# Patient Record
Sex: Male | Born: 1965 | Race: Black or African American | Hispanic: No | Marital: Married | Smoking: Never smoker
Health system: Southern US, Community
[De-identification: ages and names within clinical notes are randomized; demographics above are authoritative.]

## PROBLEM LIST (undated history)

## (undated) ENCOUNTER — Ambulatory Visit (HOSPITAL_COMMUNITY): Source: Home / Self Care

## (undated) DIAGNOSIS — K5792 Diverticulitis of intestine, part unspecified, without perforation or abscess without bleeding: Secondary | ICD-10-CM

## (undated) DIAGNOSIS — Z6834 Body mass index (BMI) 34.0-34.9, adult: Secondary | ICD-10-CM

---

## 2000-08-11 ENCOUNTER — Emergency Department (HOSPITAL_COMMUNITY): Admission: EM | Admit: 2000-08-11 | Discharge: 2000-08-11 | Payer: Self-pay | Admitting: Emergency Medicine

## 2000-08-11 ENCOUNTER — Encounter: Payer: Self-pay | Admitting: Emergency Medicine

## 2001-10-15 ENCOUNTER — Emergency Department (HOSPITAL_COMMUNITY): Admission: EM | Admit: 2001-10-15 | Discharge: 2001-10-15 | Payer: Self-pay | Admitting: *Deleted

## 2001-10-17 ENCOUNTER — Emergency Department (HOSPITAL_COMMUNITY): Admission: EM | Admit: 2001-10-17 | Discharge: 2001-10-17 | Payer: Self-pay | Admitting: Emergency Medicine

## 2001-11-13 ENCOUNTER — Emergency Department (HOSPITAL_COMMUNITY): Admission: EM | Admit: 2001-11-13 | Discharge: 2001-11-13 | Payer: Self-pay | Admitting: Emergency Medicine

## 2005-12-30 ENCOUNTER — Emergency Department (HOSPITAL_COMMUNITY): Admission: EM | Admit: 2005-12-30 | Discharge: 2005-12-30 | Payer: Self-pay | Admitting: Emergency Medicine

## 2009-03-29 ENCOUNTER — Emergency Department (HOSPITAL_COMMUNITY): Admission: EM | Admit: 2009-03-29 | Discharge: 2009-03-30 | Payer: Self-pay | Admitting: Emergency Medicine

## 2009-09-04 ENCOUNTER — Emergency Department (HOSPITAL_COMMUNITY): Admission: EM | Admit: 2009-09-04 | Discharge: 2009-09-04 | Payer: Self-pay | Admitting: Emergency Medicine

## 2009-09-21 ENCOUNTER — Emergency Department (HOSPITAL_COMMUNITY): Admission: EM | Admit: 2009-09-21 | Discharge: 2009-09-21 | Payer: Self-pay | Admitting: Emergency Medicine

## 2010-02-05 ENCOUNTER — Emergency Department (HOSPITAL_COMMUNITY)
Admission: EM | Admit: 2010-02-05 | Discharge: 2010-02-05 | Payer: Self-pay | Source: Home / Self Care | Admitting: Emergency Medicine

## 2010-02-05 LAB — URINALYSIS, ROUTINE W REFLEX MICROSCOPIC
Bilirubin Urine: NEGATIVE
Hgb urine dipstick: NEGATIVE
Protein, ur: NEGATIVE mg/dL
Urobilinogen, UA: 1 mg/dL (ref 0.0–1.0)

## 2010-02-05 LAB — DIFFERENTIAL
Basophils Absolute: 0 10*3/uL (ref 0.0–0.1)
Basophils Relative: 0 % (ref 0–1)
Eosinophils Absolute: 0.3 10*3/uL (ref 0.0–0.7)
Eosinophils Relative: 4 % (ref 0–5)
Lymphocytes Relative: 14 % (ref 12–46)
Lymphs Abs: 1.2 10*3/uL (ref 0.7–4.0)
Monocytes Absolute: 0.8 10*3/uL (ref 0.1–1.0)
Monocytes Relative: 10 % (ref 3–12)
Neutro Abs: 6.2 10*3/uL (ref 1.7–7.7)
Neutrophils Relative %: 73 % (ref 43–77)

## 2010-02-05 LAB — CBC
HCT: 36.8 % — ABNORMAL LOW (ref 39.0–52.0)
Hemoglobin: 12 g/dL — ABNORMAL LOW (ref 13.0–17.0)
MCH: 26.7 pg (ref 26.0–34.0)
MCHC: 32.6 g/dL (ref 30.0–36.0)
MCV: 81.8 fL (ref 78.0–100.0)
Platelets: 231 10*3/uL (ref 150–400)
RBC: 4.5 MIL/uL (ref 4.22–5.81)
RDW: 14.6 % (ref 11.5–15.5)
WBC: 8.5 10*3/uL (ref 4.0–10.5)

## 2010-02-05 LAB — BASIC METABOLIC PANEL
BUN: 12 mg/dL (ref 6–23)
CO2: 27 mEq/L (ref 19–32)
Calcium: 9 mg/dL (ref 8.4–10.5)
Chloride: 102 mEq/L (ref 96–112)
Creatinine, Ser: 1.28 mg/dL (ref 0.4–1.5)
GFR calc Af Amer: >60 mL/min (ref >60)
GFR calc non Af Amer: >60 mL/min (ref >60)
Glucose, Bld: 107 mg/dL — ABNORMAL HIGH (ref 70–99)
Potassium: 3.6 mEq/L (ref 3.5–5.1)
Sodium: 139 mEq/L (ref 135–145)

## 2010-02-05 LAB — OCCULT BLOOD, POC DEVICE: Fecal Occult Bld: NEGATIVE

## 2010-04-02 LAB — URINALYSIS, ROUTINE W REFLEX MICROSCOPIC
Bilirubin Urine: NEGATIVE
Glucose, UA: NEGATIVE mg/dL
Ketones, ur: NEGATIVE mg/dL
pH: 6 (ref 5.0–8.0)

## 2011-08-22 ENCOUNTER — Inpatient Hospital Stay (HOSPITAL_COMMUNITY)
Admission: EM | Admit: 2011-08-22 | Discharge: 2011-08-27 | DRG: 183 | Disposition: A | Discharge status: Home / Self Care | Payer: BC Managed Care – PPO | Attending: General Surgery | Admitting: General Surgery

## 2011-08-22 ENCOUNTER — Encounter (HOSPITAL_COMMUNITY): Payer: Self-pay | Admitting: Adult Health

## 2011-08-22 DIAGNOSIS — K5732 Diverticulitis of large intestine without perforation or abscess without bleeding: Principal | ICD-10-CM | POA: Diagnosis present

## 2011-08-22 DIAGNOSIS — IMO0001 Reserved for inherently not codable concepts without codable children: Secondary | ICD-10-CM

## 2011-08-22 DIAGNOSIS — K5720 Diverticulitis of large intestine with perforation and abscess without bleeding: Secondary | ICD-10-CM

## 2011-08-22 DIAGNOSIS — Z6834 Body mass index (BMI) 34.0-34.9, adult: Secondary | ICD-10-CM

## 2011-08-22 HISTORY — DX: Diverticulitis of intestine, part unspecified, without perforation or abscess without bleeding: K57.92

## 2011-08-22 HISTORY — DX: Body mass index (bmi) 34.0-34.9, adult: Z68.34

## 2011-08-22 LAB — CBC WITH DIFFERENTIAL/PLATELET
Basophils Absolute: 0 10*3/uL (ref 0.0–0.1)
Eosinophils Relative: 1 % (ref 0–5)
HCT: 40.9 % (ref 39.0–52.0)
Lymphocytes Relative: 10 % — ABNORMAL LOW (ref 12–46)
MCV: 83.6 fL (ref 78.0–100.0)
Monocytes Absolute: 1.4 10*3/uL — ABNORMAL HIGH (ref 0.1–1.0)
Monocytes Relative: 12 % (ref 3–12)
RDW: 13.9 % (ref 11.5–15.5)
WBC: 12.2 10*3/uL — ABNORMAL HIGH (ref 4.0–10.5)

## 2011-08-22 LAB — BASIC METABOLIC PANEL
BUN: 13 mg/dL (ref 6–23)
CO2: 26 mEq/L (ref 19–32)
Chloride: 97 mEq/L (ref 96–112)
Creatinine, Ser: 1.33 mg/dL (ref 0.50–1.35)

## 2011-08-22 MED ORDER — MORPHINE SULFATE 4 MG/ML IJ SOLN
4.0000 mg | Freq: Once | INTRAMUSCULAR | Status: AC
  Administered 2011-08-22: 4 mg via INTRAVENOUS
  Filled 2011-08-22: qty 1

## 2011-08-22 MED ORDER — ONDANSETRON HCL 4 MG/2ML IJ SOLN
4.0000 mg | Freq: Once | INTRAMUSCULAR | Status: AC
  Administered 2011-08-22: 4 mg via INTRAVENOUS
  Filled 2011-08-22: qty 2

## 2011-08-22 NOTE — ED Notes (Signed)
C/O lower abdominal pain that began on Monday associated with loose stools. Pt has a hx of diverticulitis and this pain feels the same. Denies nausea. Denies urinary symptoms.  Pain is described as pulling.

## 2011-08-22 NOTE — ED Provider Notes (Addendum)
History     CSN: [id number]  Arrival date & time 08/22/11  2003   First MD Initiated Contact with Patient 08/22/11 2328      Chief Complaint  Patient presents with  . Abdominal Pain    (Consider location/radiation/quality/duration/timing/severity/associated sxs/prior treatment) Patient is a 46 y.o. male presenting with abdominal pain. The history is provided by the patient.  Abdominal Pain The primary symptoms of the illness include abdominal pain. The current episode started yesterday. The onset of the illness was gradual.  The patient states that she believes she is currently not pregnant. The patient has not had a change in bowel habit.    Past Medical History  Diagnosis Date  . Diverticulitis     History reviewed. No pertinent past surgical history.  History reviewed. No pertinent family history.  History  Substance Use Topics  . Smoking status: Never Smoker   . Smokeless tobacco: Not on file  . Alcohol Use: No      Review of Systems  Gastrointestinal: Positive for abdominal pain.  All other systems reviewed and are negative.    Allergies  Review of patient's allergies indicates no known allergies.  Home Medications  No current outpatient prescriptions on file.  BP 121/81  Pulse 77  Temp 98.5 F (36.9 C)  Resp 16  SpO2 97%  Physical Exam  Constitutional: He is oriented to person, place, and time. He appears well-developed and well-nourished.  HENT:  Head: Normocephalic and atraumatic.  Eyes: Conjunctivae are normal. Pupils are equal, round, and reactive to light.  Neck: Normal range of motion. Neck supple.  Cardiovascular: Normal rate, regular rhythm, normal heart sounds and intact distal pulses.   Pulmonary/Chest: Effort normal and breath sounds normal.  Abdominal: Soft. Bowel sounds are normal.    Neurological: He is alert and oriented to person, place, and time.  Skin: Skin is warm and dry.  Psychiatric: He has a normal mood and affect.  His behavior is normal. Judgment and thought content normal.    ED Course  Procedures (including critical care time)  Labs Reviewed  BASIC METABOLIC PANEL - Abnormal; Notable for the following:    Glucose, Bld 123 (*)     GFR calc non Af Amer 64 (*)     GFR calc Af Amer 74 (*)     All other components within normal limits  CBC WITH DIFFERENTIAL - Abnormal; Notable for the following:    WBC 12.2 (*)     Neutrophils Relative 78 (*)     Neutro Abs 9.5 (*)     Lymphocytes Relative 10 (*)     Monocytes Absolute 1.4 (*)     All other components within normal limits   No results found.   No diagnosis found.    MDM  Pt with hx of diverticulitis,   Mild leukocytosis,  Mild tenderness.  Will ct, analgesia,  reassess  + contained perforation.  Discussed wih surgery,  Will admit      Rosanne Ashing, MD 08/22/11 1478  Sherman Donaldson Lytle Michaels, MD 08/23/11 2956

## 2011-08-23 ENCOUNTER — Encounter (HOSPITAL_COMMUNITY): Payer: Self-pay | Admitting: Radiology

## 2011-08-23 ENCOUNTER — Emergency Department (HOSPITAL_COMMUNITY): Payer: BC Managed Care – PPO

## 2011-08-23 DIAGNOSIS — K5732 Diverticulitis of large intestine without perforation or abscess without bleeding: Secondary | ICD-10-CM

## 2011-08-23 LAB — CBC
HCT: 40 % (ref 39.0–52.0)
Hemoglobin: 13.4 g/dL (ref 13.0–17.0)
MCH: 28 pg (ref 26.0–34.0)
MCHC: 33.5 g/dL (ref 30.0–36.0)
MCV: 83.5 fL (ref 78.0–100.0)
RBC: 4.79 MIL/uL (ref 4.22–5.81)

## 2011-08-23 LAB — COMPREHENSIVE METABOLIC PANEL
ALT: 34 U/L (ref 0–53)
AST: 22 U/L (ref 0–37)
Albumin: 3.1 g/dL — ABNORMAL LOW (ref 3.5–5.2)
CO2: 27 mEq/L (ref 19–32)
Calcium: 9 mg/dL (ref 8.4–10.5)
Sodium: 137 mEq/L (ref 135–145)
Total Protein: 7.1 g/dL (ref 6.0–8.3)

## 2011-08-23 MED ORDER — CIPROFLOXACIN IN D5W 400 MG/200ML IV SOLN
400.0000 mg | Freq: Two times a day (BID) | INTRAVENOUS | Status: DC
  Administered 2011-08-23 – 2011-08-26 (×7): 400 mg via INTRAVENOUS
  Filled 2011-08-23 (×9): qty 200

## 2011-08-23 MED ORDER — METRONIDAZOLE IN NACL 5-0.79 MG/ML-% IV SOLN
500.0000 mg | Freq: Once | INTRAVENOUS | Status: AC
  Administered 2011-08-23: 500 mg via INTRAVENOUS
  Filled 2011-08-23: qty 100

## 2011-08-23 MED ORDER — ONDANSETRON HCL 4 MG/2ML IJ SOLN: 4.0000 mg | Freq: Four times a day (QID) | INTRAMUSCULAR | Status: DC | PRN

## 2011-08-23 MED ORDER — LEVOFLOXACIN IN D5W 750 MG/150ML IV SOLN
750.0000 mg | Freq: Once | INTRAVENOUS | Status: DC
  Administered 2011-08-23: 750 mg via INTRAVENOUS
  Filled 2011-08-23: qty 150

## 2011-08-23 MED ORDER — HYDROMORPHONE HCL PF 1 MG/ML IJ SOLN
1.0000 mg | INTRAMUSCULAR | Status: DC | PRN
  Administered 2011-08-23 – 2011-08-24 (×5): 1 mg via INTRAVENOUS
  Filled 2011-08-23 (×5): qty 1

## 2011-08-23 MED ORDER — PANTOPRAZOLE SODIUM 40 MG IV SOLR
40.0000 mg | Freq: Every day | INTRAVENOUS | Status: DC
  Administered 2011-08-23 – 2011-08-26 (×4): 40 mg via INTRAVENOUS
  Filled 2011-08-23 (×6): qty 40

## 2011-08-23 MED ORDER — ENOXAPARIN SODIUM 40 MG/0.4ML ~~LOC~~ SOLN
40.0000 mg | SUBCUTANEOUS | Status: DC
  Administered 2011-08-23 – 2011-08-26 (×4): 40 mg via SUBCUTANEOUS
  Filled 2011-08-23 (×5): qty 0.4

## 2011-08-23 MED ORDER — IOHEXOL 300 MG/ML  SOLN
80.0000 mL | Freq: Once | INTRAMUSCULAR | Status: AC | PRN
  Administered 2011-08-23: 80 mL via INTRAVENOUS

## 2011-08-23 MED ORDER — BIOTENE DRY MOUTH MT LIQD
15.0000 mL | Freq: Two times a day (BID) | OROMUCOSAL | Status: DC
  Administered 2011-08-23 – 2011-08-25 (×4): 15 mL via OROMUCOSAL

## 2011-08-23 MED ORDER — ACETAMINOPHEN 325 MG PO TABS
650.0000 mg | ORAL_TABLET | Freq: Four times a day (QID) | ORAL | Status: DC | PRN
  Administered 2011-08-23 (×2): 650 mg via ORAL
  Filled 2011-08-23 (×2): qty 2

## 2011-08-23 MED ORDER — KCL IN DEXTROSE-NACL 20-5-0.9 MEQ/L-%-% IV SOLN
INTRAVENOUS | Status: DC
  Administered 2011-08-23 – 2011-08-26 (×7): via INTRAVENOUS
  Filled 2011-08-23 (×10): qty 1000

## 2011-08-23 MED ORDER — METRONIDAZOLE IN NACL 5-0.79 MG/ML-% IV SOLN
500.0000 mg | Freq: Three times a day (TID) | INTRAVENOUS | Status: DC
  Administered 2011-08-23 – 2011-08-26 (×9): 500 mg via INTRAVENOUS
  Filled 2011-08-23 (×11): qty 100

## 2011-08-23 MED ORDER — ACETAMINOPHEN 650 MG RE SUPP: 650.0000 mg | Freq: Four times a day (QID) | RECTAL | Status: DC | PRN

## 2011-08-23 NOTE — H&P (Signed)
Lance Fisher is an 46 y.o. male.   Chief Complaint: Abdominal pain HPI: Asked to see patient at the request of Dr Harvest Dark due to LLQ abdominal pain.  Started 4 days ago. Gradually worsening.   BM today.  No diarrhea.  Pain 5- 6/10.  Sharp.  Chills noted.  Made worse with movement. Ct SHOWS ACUTE DIVERTICULITIS.  Question microperforation.  Past Medical History  Diagnosis Date  . Diverticulitis     History reviewed. No pertinent past surgical history.  History reviewed. No pertinent family history. Social History:  reports that he has never smoked. He does not have any smokeless tobacco history on file. He reports that he does not drink alcohol or use illicit drugs.  Allergies: No Known Allergies   (Not in a hospital admission)  Results for orders placed during the hospital encounter of 08/22/11 (from the past 48 hour(s))  BASIC METABOLIC PANEL     Status: Abnormal   Collection Time   08/22/11  8:14 PM      Component Value Range Comment   Sodium 135  135 - 145 mEq/L    Potassium 3.5  3.5 - 5.1 mEq/L    Chloride 97  96 - 112 mEq/L    CO2 26  19 - 32 mEq/L    Glucose, Bld 123 (*) 70 - 99 mg/dL    BUN 13  6 - 23 mg/dL    Creatinine, Ser 1.61  0.50 - 1.35 mg/dL    Calcium 9.4  8.4 - 09.6 mg/dL    GFR calc non Af Amer 64 (*) >90 mL/min    GFR calc Af Amer 74 (*) >90 mL/min   CBC WITH DIFFERENTIAL     Status: Abnormal   Collection Time   08/22/11  8:14 PM      Component Value Range Comment   WBC 12.2 (*) 4.0 - 10.5 K/uL    RBC 4.89  4.22 - 5.81 MIL/uL    Hemoglobin 14.0  13.0 - 17.0 g/dL    HCT 04.5  40.9 - 81.1 %    MCV 83.6  78.0 - 100.0 fL    MCH 28.6  26.0 - 34.0 pg    MCHC 34.2  30.0 - 36.0 g/dL    RDW 91.4  78.2 - 95.6 %    Platelets 205  150 - 400 K/uL    Neutrophils Relative 78 (*) 43 - 77 %    Neutro Abs 9.5 (*) 1.7 - 7.7 K/uL    Lymphocytes Relative 10 (*) 12 - 46 %    Lymphs Abs 1.2  0.7 - 4.0 K/uL    Monocytes Relative 12  3 - 12 %    Monocytes Absolute 1.4 (*)  0.1 - 1.0 K/uL    Eosinophils Relative 1  0 - 5 %    Eosinophils Absolute 0.1  0.0 - 0.7 K/uL    Basophils Relative 0  0 - 1 %    Basophils Absolute 0.0  0.0 - 0.1 K/uL    Ct Abdomen Pelvis W Contrast  08/23/2011  *RADIOLOGY REPORT*  Clinical Data: Pain.  Nausea.  History of diverticulitis.  CT ABDOMEN AND PELVIS WITH CONTRAST  Technique:  Multidetector CT imaging of the abdomen and pelvis was performed following the standard protocol during bolus administration of intravenous contrast.  Contrast: 80mL OMNIPAQUE IOHEXOL 300 MG/ML  SOLN  Comparison: 02/05/2010.  Findings: Lung Bases: Dependent atelectasis.  Liver:  Normal.  Spleen:  Normal.  Gallbladder:  Normal.  Common bile  duct:  Normal.  Pancreas:  Normal.  Adrenal glands:  Normal.  Kidneys:  Normal renal enhancement.  Normal delayed excretion of contrast.  The ureters appear normal.  Stomach:  Normal.  Small bowel:  No obstruction or inflammatory changes.  Colon:   Normal appendix.  Inflamed colon is present in the proximal to mid sigmoid without perforation.  There is marked pericolonic soft tissue stranding and extensive diverticulosis. There appears to be thinning of the inferior wall of the colon along the sigmoid (coronal image number 37 series 401) suggesting contained perforation.  No abscess.  No free air.  Pelvic Genitourinary:  Urinary bladder appears normal.  Question varicoceles.  No inguinal adenopathy.  Bones:  Normal.  Vasculature: Grossly normal.  IMPRESSION: Acute sigmoid diverticulitis.  Attenuation of the undersurface of the sigmoid colon suggests developing contained perforation.  No abscess.  Small amount of free fluid nearby.  Extensive colonic diverticulosis.  Original Report Authenticated By: Andreas Newport, M.D.    Review of Systems  Constitutional: Positive for fever and chills.  HENT: Negative.   Eyes: Negative.   Respiratory: Negative.   Cardiovascular: Negative.   Gastrointestinal: Positive for abdominal pain.  Negative for nausea, vomiting, diarrhea and constipation.  Genitourinary: Negative.   Musculoskeletal: Negative.   Skin: Negative.   Endo/Heme/Allergies: Negative.   Psychiatric/Behavioral: Negative.     Blood pressure 97/55, pulse 83, temperature 98.5 F (36.9 C), resp. rate 16, SpO2 100.00%. Physical Exam  Constitutional: He is oriented to person, place, and time. He appears well-developed and well-nourished.  HENT:  Head: Normocephalic and atraumatic.  Eyes: EOM are normal. Pupils are equal, round, and reactive to light.  Neck: Normal range of motion. Neck supple.  Cardiovascular: Normal rate and regular rhythm.   Respiratory: Effort normal and breath sounds normal.  GI: He exhibits distension. There is tenderness.    Musculoskeletal: Normal range of motion.  Neurological: He is alert and oriented to person, place, and time.  Skin: Skin is warm and dry.  Psychiatric: He has a normal mood and affect. His behavior is normal. Thought content normal.     Assessment/Plan Acute diverticulitis with possible contained perforation.  Second attack in 19 months. Admit for IV ABX and bowel rest.  Laurna Shetley A. 08/23/2011, 2:45 AM

## 2011-08-23 NOTE — ED Notes (Signed)
Patient transported to CT 

## 2011-08-23 NOTE — Progress Notes (Signed)
Subjective: Feels better than last night.  Still tender below umbilicus, more on right side than left.  Objective: Vital signs in last 24 hours: Temp:  [98.5 F (36.9 C)] 98.5 F (36.9 C) (08/16 0501) Pulse Rate:  [77-83] 77  (08/16 0501) Resp:  [16] 16  (08/16 0231) BP: (97-121)/(55-81) 99/62 mmHg (08/16 0501) SpO2:  [97 %-100 %] 100 % (08/16 0501) Weight:  [220 lb 10.9 oz (100.1 kg)-222 lb 14.2 oz (101.1 kg)] 222 lb 14.2 oz (101.1 kg) (08/16 0501)  NPO, afebrile, BSS, BP is a low normal. AM labs stable.    Intake/Output from previous day:   Intake/Output this shift:    General appearance: alert, cooperative and no distress Resp: clear to auscultation bilaterally GI: mildly tender below umbilicus, no distension, rebound, few BS  Lab Results:   [facility] 08/23/11 0627 08/22/11 2014  WBC 11.5* 12.2*  HGB 13.4 14.0  HCT 40.0 40.9  PLT 200 205    BMET  Basename 08/23/11 0627 08/22/11 2014  NA 137 135  K 3.7 3.5  CL 101 97  CO2 27 26  GLUCOSE 125* 123*  BUN 13 13  CREATININE 1.20 1.33  CALCIUM 9.0 9.4   PT/INR No results found for this basename: LABPROT:2,INR:2 in the last 72 hours   Lab 08/23/11 0627  AST 22  ALT 34  ALKPHOS 96  BILITOT 1.4*  PROT 7.1  ALBUMIN 3.1*     Lipase  No results found for this basename: lipase     Studies/Results: Ct Abdomen Pelvis W Contrast  08/23/2011  *RADIOLOGY REPORT*  Clinical Data: Pain.  Nausea.  History of diverticulitis.  CT ABDOMEN AND PELVIS WITH CONTRAST  Technique:  Multidetector CT imaging of the abdomen and pelvis was performed following the standard protocol during bolus administration of intravenous contrast.  Contrast: 80mL OMNIPAQUE IOHEXOL 300 MG/ML  SOLN  Comparison: 02/05/2010.  Findings: Lung Bases: Dependent atelectasis.  Liver:  Normal.  Spleen:  Normal.  Gallbladder:  Normal.  Common bile duct:  Normal.  Pancreas:  Normal.  Adrenal glands:  Normal.  Kidneys:  Normal renal enhancement.  Normal  delayed excretion of contrast.  The ureters appear normal.  Stomach:  Normal.  Small bowel:  No obstruction or inflammatory changes.  Colon:   Normal appendix.  Inflamed colon is present in the proximal to mid sigmoid without perforation.  There is marked pericolonic soft tissue stranding and extensive diverticulosis. There appears to be thinning of the inferior wall of the colon along the sigmoid (coronal image number 37 series 401) suggesting contained perforation.  No abscess.  No free air.  Pelvic Genitourinary:  Urinary bladder appears normal.  Question varicoceles.  No inguinal adenopathy.  Bones:  Normal.  Vasculature: Grossly normal.  IMPRESSION: Acute sigmoid diverticulitis.  Attenuation of the undersurface of the sigmoid colon suggests developing contained perforation.  No abscess.  Small amount of free fluid nearby.  Extensive colonic diverticulosis.  Original Report Authenticated By: Andreas Newport, M.D.    Medications:    . ciprofloxacin  400 mg Intravenous Q12H  . enoxaparin (LOVENOX) injection  40 mg Subcutaneous Q24H  . metronidazole  500 mg Intravenous Once  . metronidazole  500 mg Intravenous Q8H  .  morphine injection  4 mg Intravenous Once  . ondansetron (ZOFRAN) IV  4 mg Intravenous Once  . pantoprazole (PROTONIX) IV  40 mg Intravenous QHS  . DISCONTD: levofloxacin (LEVAQUIN) IV  750 mg Intravenous Once    Assessment/Plan Acute diverticulitis with possible  contained perforation 2nd time in 19 months BMI 34   Plan:  Bowel rest, antibiotics, and IV hydration.    LOS: 1 day    Lance Fisher 08/23/2011

## 2011-08-24 NOTE — Progress Notes (Signed)
Patient ID: Lang Zingg, male   DOB: 10-20-1965, 46 y.o.   MRN: [id number]    Subjective: States that he is feeling better, no abdominal tenderness this am, + BS but no BM or flatus, no N/V.  Objective: Vital signs in last 24 hours: Temp:  [97.7 F (36.5 C)-100.9 F (38.3 C)] 98.1 F (36.7 C) (08/17 0550) Pulse Rate:  [64-85] 64  (08/17 0550) Resp:  [14-18] 14  (08/17 0550) BP: (96-131)/(54-74) 105/72 mmHg (08/17 0550) SpO2:  [96 %-99 %] 99 % (08/17 0550)  NPO, afebrile, BSS, BP is a low normal. AM labs stable. Last BM Date: 08/22/11  Intake/Output from previous day: 08/16 0701 - 08/17 0700 In: 3146.7 [I.V.:2246.7; IV Piggyback:900] Out: 750 [Urine:750] Intake/Output this shift:    General appearance: A/A/O, no distress Chest: CTA bilaterally Cardiac: RRR no M/R/G Abdomen: obese, but soft, non tender this am, +BS, no flatus or BM  VSS, afebrile, no labs today. Last Check WBC were trending down, and labs were stable.   Lab Results:   [facility] 08/23/11 0627 08/22/11 2014  WBC 11.5* 12.2*  HGB 13.4 14.0  HCT 40.0 40.9  PLT 200 205    BMET  Basename 08/23/11 0627 08/22/11 2014  NA 137 135  K 3.7 3.5  CL 101 97  CO2 27 26  GLUCOSE 125* 123*  BUN 13 13  CREATININE 1.20 1.33  CALCIUM 9.0 9.4   PT/INR No results found for this basename: LABPROT:2,INR:2 in the last 72 hours   Lab 08/23/11 0627  AST 22  ALT 34  ALKPHOS 96  BILITOT 1.4*  PROT 7.1  ALBUMIN 3.1*     Lipase  No results found for this basename: lipase     Studies/Results: Ct Abdomen Pelvis W Contrast  08/23/2011  *RADIOLOGY REPORT*  Clinical Data: Pain.  Nausea.  History of diverticulitis.  CT ABDOMEN AND PELVIS WITH CONTRAST  Technique:  Multidetector CT imaging of the abdomen and pelvis was performed following the standard protocol during bolus administration of intravenous contrast.  Contrast: 80mL OMNIPAQUE IOHEXOL 300 MG/ML  SOLN  Comparison: 02/05/2010.  Findings: Lung Bases: Dependent  atelectasis.  Liver:  Normal.  Spleen:  Normal.  Gallbladder:  Normal.  Common bile duct:  Normal.  Pancreas:  Normal.  Adrenal glands:  Normal.  Kidneys:  Normal renal enhancement.  Normal delayed excretion of contrast.  The ureters appear normal.  Stomach:  Normal.  Small bowel:  No obstruction or inflammatory changes.  Colon:   Normal appendix.  Inflamed colon is present in the proximal to mid sigmoid without perforation.  There is marked pericolonic soft tissue stranding and extensive diverticulosis. There appears to be thinning of the inferior wall of the colon along the sigmoid (coronal image number 37 series 401) suggesting contained perforation.  No abscess.  No free air.  Pelvic Genitourinary:  Urinary bladder appears normal.  Question varicoceles.  No inguinal adenopathy.  Bones:  Normal.  Vasculature: Grossly normal.  IMPRESSION: Acute sigmoid diverticulitis.  Attenuation of the undersurface of the sigmoid colon suggests developing contained perforation.  No abscess.  Small amount of free fluid nearby.  Extensive colonic diverticulosis.  Original Report Authenticated By: Andreas Newport, M.D.    Medications:    . antiseptic oral rinse  15 mL Mouth Rinse BID  . ciprofloxacin  400 mg Intravenous Q12H  . enoxaparin (LOVENOX) injection  40 mg Subcutaneous Q24H  . metronidazole  500 mg Intravenous Q8H  . pantoprazole (PROTONIX) IV  40 mg Intravenous  QHS    Assessment/Plan Acute diverticulitis with possible contained perforation 2nd time in 19 months BMI 34   Plan:   Keep NPO for now IV antibiotics IVF ambulate    LOS: 2 days    Roxi Hlavaty 08/24/2011

## 2011-08-24 NOTE — Progress Notes (Signed)
Patient ambulating, hope to start clears tomorrow Patient examined and I agree with the assessment and plan  Violeta Gelinas, MD, MPH, FACS Pager: [phone]  08/24/2011 12:07 PM

## 2011-08-25 NOTE — Progress Notes (Signed)
  Subjective: Reports almost no pain +flatus  Objective: Vital signs in last 24 hours: Temp:  [97.8 F (36.6 C)-98.7 F (37.1 C)] 97.8 F (36.6 C) (08/18 0519) Pulse Rate:  [66-78] 78  (08/18 0519) Resp:  [18] 18  (08/18 0519) BP: (100-123)/(72-85) 107/73 mmHg (08/18 0519) SpO2:  [97 %-100 %] 97 % (08/18 0519) Last BM Date: 08/22/11  Intake/Output from previous day:   Intake/Output this shift:    Comfortable Lungs clear Abdomen soft, +mild tenderness with guarding in LLQ and suprapubic  Lab Results:   [facility] 08/23/11 0627 08/22/11 2014  WBC 11.5* 12.2*  HGB 13.4 14.0  HCT 40.0 40.9  PLT 200 205   BMET  Basename 08/23/11 0627 08/22/11 2014  NA 137 135  K 3.7 3.5  CL 101 97  CO2 27 26  GLUCOSE 125* 123*  BUN 13 13  CREATININE 1.20 1.33  CALCIUM 9.0 9.4   PT/INR No results found for this basename: LABPROT:2,INR:2 in the last 72 hours ABG No results found for this basename: PHART:2,PCO2:2,PO2:2,HCO3:2 in the last 72 hours  Studies/Results: No results found.  Anti-infectives: Anti-infectives     Start     Dose/Rate Route Frequency Ordered Stop   08/23/11 0330   metroNIDAZOLE (FLAGYL) IVPB 500 mg        500 mg 100 mL/hr over 60 Minutes Intravenous Every 8 hours 08/23/11 0322     08/23/11 0330   ciprofloxacin (CIPRO) IVPB 400 mg        400 mg 200 mL/hr over 60 Minutes Intravenous Every 12 hours 08/23/11 0322     08/23/11 0230   levofloxacin (LEVAQUIN) IVPB 750 mg  Status:  Discontinued        750 mg 100 mL/hr over 90 Minutes Intravenous  Once 08/23/11 0223 08/23/11 0516   08/23/11 0230   metroNIDAZOLE (FLAGYL) IVPB 500 mg        500 mg 100 mL/hr over 60 Minutes Intravenous  Once 08/23/11 0223 08/23/11 0340          Assessment/Plan: s/p * No surgery found * Diverticulitis  Start clear liquids Continue IV antibiotics  LOS: 3 days    Stephaniemarie Stoffel A 08/25/2011

## 2011-08-26 ENCOUNTER — Encounter (HOSPITAL_COMMUNITY): Payer: Self-pay | Admitting: General Surgery

## 2011-08-26 DIAGNOSIS — IMO0001 Reserved for inherently not codable concepts without codable children: Secondary | ICD-10-CM

## 2011-08-26 DIAGNOSIS — Z6834 Body mass index (BMI) 34.0-34.9, adult: Secondary | ICD-10-CM

## 2011-08-26 HISTORY — DX: Body mass index (BMI) 34.0-34.9, adult: Z68.34

## 2011-08-26 MED ORDER — CIPROFLOXACIN HCL 500 MG PO TABS
500.0000 mg | ORAL_TABLET | Freq: Two times a day (BID) | ORAL | Status: DC
  Administered 2011-08-26 – 2011-08-27 (×2): 500 mg via ORAL
  Filled 2011-08-26 (×4): qty 1

## 2011-08-26 MED ORDER — METRONIDAZOLE 500 MG PO TABS
500.0000 mg | ORAL_TABLET | Freq: Three times a day (TID) | ORAL | Status: DC
  Administered 2011-08-26 – 2011-08-27 (×3): 500 mg via ORAL
  Filled 2011-08-26 (×6): qty 1

## 2011-08-26 NOTE — Progress Notes (Signed)
Patient ID: Lance Fisher, male   DOB: [birth year], 46 y.o.   MRN: [id number] Patient ID: Lance Fisher, male   DOB: Jan 07, 1966, 46 y.o.   MRN: [id number]    Subjective: States that he is feeling better, no abdominal tenderness this am, + BS ,BM, flatus. No N/V or bloating. Tolerated clear liquid diet.  Objective: Vital signs in last 24 hours: Temp:  [97.3 F (36.3 C)-98.5 F (36.9 C)] 97.3 F (36.3 C) (08/19 0623) Pulse Rate:  [60-79] 60  (08/19 0623) Resp:  [18] 18  (08/19 0623) BP: (120-135)/(57-76) 120/76 mmHg (08/19 0623) SpO2:  [99 %-100 %] 99 % (08/19 0623)  NPO, afebrile, BSS, BP is a low normal. AM labs stable. Last BM Date: 08/22/11  Intake/Output from previous day: 08/18 0701 - 08/19 0700 In: 2520 [P.O.:1320; I.V.:1200] Out: -  Intake/Output this shift:    General appearance: A/A/O, no distress Chest: CTA bilaterally Cardiac: RRR no M/R/G Abdomen: obese, but soft, non tender this am, +BS, BM. VSS, afebrile.   Lab Results:  No results found for this basename: WBC:2,HGB:2,HCT:2,PLT:2 in the last 72 hours  BMET No results found for this basename: NA:2,K:2,CL:2,CO2:2,GLUCOSE:2,BUN:2,CREATININE:2,CALCIUM:2 in the last 72 hours PT/INR No results found for this basename: LABPROT:2,INR:2 in the last 72 hours   Lab 08/23/11 0627  AST 22  ALT 34  ALKPHOS 96  BILITOT 1.4*  PROT 7.1  ALBUMIN 3.1*     Lipase  No results found for this basename: lipase     Studies/Results: No results found.  Medications:    . antiseptic oral rinse  15 mL Mouth Rinse BID  . ciprofloxacin  400 mg Intravenous Q12H  . enoxaparin (LOVENOX) injection  40 mg Subcutaneous Q24H  . metronidazole  500 mg Intravenous Q8H  . pantoprazole (PROTONIX) IV  40 mg Intravenous QHS    Assessment/Plan Acute diverticulitis with possible contained perforation 2nd time in 19 months BMI 34   Plan:   Advance diet to full liquid Change abx to po Ambulate ? Discharge soon.    LOS: 4 days     Lance Fisher 08/26/2011

## 2011-08-26 NOTE — Progress Notes (Signed)
UR completed 

## 2011-08-26 NOTE — Discharge Summary (Signed)
Physician Discharge Summary  Patient ID: Lance Fisher MRN: [id number] DOB/AGE: Jun 17, 1965 46 y.o.  Admit date: 08/22/2011 Discharge date: 08/27/2011  Admission Diagnoses: Acute diverticulitis with possible contained perforation  2nd time in 19 months  BMI 34   Discharge Diagnoses: Same Active Problems:  Diverticulosis, acute  BMI 34.0-34.9,adult   PROCEDURES: none  Hospital Course: Asked to see patient at the request of Dr Harvest Dark due to LLQ abdominal pain. Started 4 days ago. Gradually worsening. BM today. No diarrhea. Pain 5- 6/10. Sharp. Chills noted. Made worse with movement. Ct SHOWS ACUTE DIVERTICULITIS. Question microperforation.  He was admitted and place on bowel rest, IV hydration and IV antibiotics.  He's made good progress and has been transitioned to oral antibiotics.  He was also placed on a low residual diet.  We plan another week of oral antibiotics at home.  He is to call if he has fever or pain again.   Follow up with Dr. Janee Morn in 2 weeks, he is also to call Dr. Chestine Spore his primary care doctor for follow up. He has not had a colonoscopy at this point.  Condition on D/C:  Improving  Disposition:  Home   Medication List  As of 08/27/2011  8:30 AM   TAKE these medications         acetaminophen 325 MG tablet   Commonly known as: TYLENOL   Take 2 tablets (650 mg total) by mouth every 6 (six) hours as needed (or Temp > 100).      ciprofloxacin 500 MG tablet   Commonly known as: CIPRO   Take 1 tablet (500 mg total) by mouth 2 (two) times daily.      metroNIDAZOLE 500 MG tablet   Commonly known as: FLAGYL   Take 1 tablet (500 mg total) by mouth every 8 (eight) hours.           Follow-up Information    Follow up with [facility], MD. Schedule an appointment as soon as possible for a visit in 2 weeks.   Contact information:   [address] [place] [address] [phone]       Call Laurena Slimmer, MD. (Follow up with Dr.  Fraser Din and let him know and see when he would like to see you.)    Contact information:   [address] [place] [address] [phone]          Signed: Sherrie George 08/27/2011, 8:30 AM

## 2011-08-26 NOTE — Progress Notes (Signed)
Patient out ambulating Agree Patient examined and I agree with the assessment and plan  Violeta Gelinas, MD, MPH, FACS Pager: [phone]  08/26/2011 1:38 PM

## 2011-08-27 MED ORDER — CIPROFLOXACIN HCL 500 MG PO TABS: 500.0000 mg | ORAL_TABLET | Freq: Two times a day (BID) | ORAL | Status: AC

## 2011-08-27 MED ORDER — ACETAMINOPHEN 325 MG PO TABS: 650.0000 mg | ORAL_TABLET | Freq: Four times a day (QID) | ORAL | Status: DC | PRN

## 2011-08-27 MED ORDER — METRONIDAZOLE 500 MG PO TABS: 500.0000 mg | ORAL_TABLET | Freq: Three times a day (TID) | ORAL | Status: AC

## 2011-08-27 NOTE — Progress Notes (Signed)
  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  Objective: Vital signs in last 24 hours: Temp:  [97.8 F (36.6 C)-98.4 F (36.9 C)] 98.4 F (36.9 C) (08/20 0530) Pulse Rate:  [60-71] 61  (08/20 0530) Resp:  [18] 18  (08/20 0530) BP: (98-119)/(52-76) 98/52 mmHg (08/20 0530) SpO2:  [98 %-100 %] 98 % (08/20 0530) Last BM Date: 08/26/11  1440 ml PO recorded, low fiber diet, BP down 3AM, afebrile, no labs  Intake/Output from previous day: 08/19 0701 - 08/20 0700 In: 1440 [P.O.:1440] Out: -  Intake/Output this shift:    General appearance: alert, cooperative and no distress GI: soft, non-tender; bowel sounds normal; no masses,  no organomegaly  Lab Results:  No results found for this basename: WBC:2,HGB:2,HCT:2,PLT:2 in the last 72 hours  BMET No results found for this basename: NA:2,K:2,CL:2,CO2:2,GLUCOSE:2,BUN:2,CREATININE:2,CALCIUM:2 in the last 72 hours PT/INR No results found for this basename: LABPROT:2,INR:2 in the last 72 hours   Lab 08/23/11 0627  AST 22  ALT 34  ALKPHOS 96  BILITOT 1.4*  PROT 7.1  ALBUMIN 3.1*     Lipase  No results found for this basename: lipase     Studies/Results: No results found.  Medications:    . ciprofloxacin  500 mg Oral BID  . enoxaparin (LOVENOX) injection  40 mg Subcutaneous Q24H  . metroNIDAZOLE  500 mg Oral Q8H  . pantoprazole (PROTONIX) IV  40 mg Intravenous QHS  . DISCONTD: antiseptic oral rinse  15 mL Mouth Rinse BID  . DISCONTD: ciprofloxacin  400 mg Intravenous Q12H  . DISCONTD: metronidazole  500 mg Intravenous Q8H    Assessment/Plan Acute diverticulitis with possible contained perforation  2nd time in 19 months  BMI 34   Plan:  Home on 1 week more of antibiotics, and follow up in the office.   LOS: 5 days    Sharone Picchi 08/27/2011

## 2011-08-27 NOTE — Progress Notes (Signed)
Doing better, abdomen soft. Patient examined and I agree with the assessment and plan  Violeta Gelinas, MD, MPH, FACS Pager: [phone]  08/27/2011 10:00 AM

## 2011-08-27 NOTE — Progress Notes (Signed)
Patient discharged to home in care of spouse. Medications and instructions reviewed with patient with all questions answered. IV d/c'd with cath intact. Patient is to follow up with Dr. Janee Morn in 2 weeks and with PCP as needed.

## 2011-08-27 NOTE — Discharge Summary (Signed)
Sheralee Qazi, MD, MPH, FACS Pager: [phone]  

## 2011-09-11 ENCOUNTER — Encounter (INDEPENDENT_AMBULATORY_CARE_PROVIDER_SITE_OTHER): Payer: Self-pay | Admitting: General Surgery

## 2011-09-11 ENCOUNTER — Ambulatory Visit (INDEPENDENT_AMBULATORY_CARE_PROVIDER_SITE_OTHER): Payer: BC Managed Care – PPO | Admitting: General Surgery

## 2011-09-11 VITALS — BP 130/84 | HR 84 | Temp 98.0°F | Resp 18 | Ht 67.0 in | Wt 207.2 lb

## 2011-09-11 DIAGNOSIS — K5732 Diverticulitis of large intestine without perforation or abscess without bleeding: Secondary | ICD-10-CM | POA: Insufficient documentation

## 2011-09-11 NOTE — Progress Notes (Signed)
Subjective:     Patient ID: Lance Fisher, male   DOB: 02-Sep-1965, 46 y.o.   MRN: [id number]  HPI Patient was recently hospitalized for sigmoid diverticulitis with small abscess.He was discharged on oral antibiotics. He is completing that tomorrow. His abdominal pain has resolved. Bowel movements were initially abnormal but have returned to normal. He has adjusted his diet and is actually lost a little bit of weight.  Review of Systems     Objective:   Physical Exam  Cardiovascular: Normal rate and normal heart sounds.   Pulmonary/Chest: Effort normal and breath sounds normal.   Abdomen is soft and nontender with no mass palpable    Assessment:     Sigmoid diverticulitis with abscess improving significantly    Plan:     Complete antibiotics. Patient has had 2 episodes of diverticulitis in the past 2 years. We discussed the possibility of needing to do elective colectomy should discontinue. He is hoping to avoid surgery. I would like to see him back in 3 months. He will continue his diet adjustments. We will see how things go in the interim.

## 2011-12-04 ENCOUNTER — Encounter (INDEPENDENT_AMBULATORY_CARE_PROVIDER_SITE_OTHER): Payer: Self-pay | Admitting: General Surgery

## 2011-12-04 ENCOUNTER — Ambulatory Visit (INDEPENDENT_AMBULATORY_CARE_PROVIDER_SITE_OTHER): Payer: BC Managed Care – PPO | Admitting: General Surgery

## 2011-12-04 VITALS — BP 120/70 | HR 60 | Temp 97.3°F | Resp 18 | Ht 68.0 in | Wt 210.0 lb

## 2011-12-04 DIAGNOSIS — K5732 Diverticulitis of large intestine without perforation or abscess without bleeding: Secondary | ICD-10-CM

## 2011-12-04 NOTE — Patient Instructions (Addendum)
Call if you develop any further abdominal pain. Gradually increase fiber in your diet.

## 2011-12-04 NOTE — Progress Notes (Signed)
Subjective:     Patient ID: Lance Fisher, male   DOB: Jun 29, 1965, 46 y.o.   MRN: [id number]  HPI Patient presents for followup of sigmoid diverticulitis. He was hospitalized in August. He has been avoiding very fatty foods. His abdominal pain is completely resolved. He is having normal bowel movements. He is planning on returning to the gym to try and lose some weight. He also has some questions regarding fiber supplements and vegetable juice.  Review of Systems     Objective:   Physical Exam  Constitutional: He appears well-developed and well-nourished.  Neck: Normal range of motion.  Cardiovascular: Normal rate and normal heart sounds.   Pulmonary/Chest: Effort normal and breath sounds normal. No respiratory distress. He has no wheezes. He has no rales.  Abdominal: Soft. He exhibits no distension and no mass. There is no tenderness. There is no rebound and no guarding.       Assessment:     Sigmoid diverticulitis has completely resolved    Plan:     I discussed the pathophysiology of diverticulitis with him. I recommend gradually increasing fiber in his diet. I recommended some fiber supplements. This was his first episode so I do not believe he needs to schedule colectomy at this time. I will gladly see him if any symptoms returned and he knows to call us if this is the case.

## 2012-06-19 ENCOUNTER — Encounter (HOSPITAL_COMMUNITY): Payer: Self-pay | Admitting: *Deleted

## 2012-06-19 ENCOUNTER — Emergency Department (HOSPITAL_COMMUNITY): Payer: BC Managed Care – PPO

## 2012-06-19 ENCOUNTER — Emergency Department (HOSPITAL_COMMUNITY)
Admission: EM | Admit: 2012-06-19 | Discharge: 2012-06-19 | Disposition: A | Discharge status: Home / Self Care | Payer: BC Managed Care – PPO | Attending: Emergency Medicine | Admitting: Emergency Medicine

## 2012-06-19 DIAGNOSIS — K2970 Gastritis, unspecified, without bleeding: Secondary | ICD-10-CM | POA: Insufficient documentation

## 2012-06-19 DIAGNOSIS — Z8719 Personal history of other diseases of the digestive system: Secondary | ICD-10-CM | POA: Insufficient documentation

## 2012-06-19 DIAGNOSIS — R10813 Right lower quadrant abdominal tenderness: Secondary | ICD-10-CM | POA: Insufficient documentation

## 2012-06-19 DIAGNOSIS — Z6834 Body mass index (BMI) 34.0-34.9, adult: Secondary | ICD-10-CM | POA: Insufficient documentation

## 2012-06-19 LAB — COMPREHENSIVE METABOLIC PANEL
CO2: 29 mEq/L (ref 19–32)
Calcium: 9.4 mg/dL (ref 8.4–10.5)
Chloride: 101 mEq/L (ref 96–112)
Creatinine, Ser: 1.19 mg/dL (ref 0.50–1.35)
GFR calc non Af Amer: 73 mL/min — ABNORMAL LOW (ref >90)
Glucose, Bld: 93 mg/dL (ref 70–99)
Total Bilirubin: 0.8 mg/dL (ref 0.3–1.2)

## 2012-06-19 LAB — URINALYSIS, ROUTINE W REFLEX MICROSCOPIC
Bilirubin Urine: NEGATIVE
Glucose, UA: NEGATIVE mg/dL
Ketones, ur: NEGATIVE mg/dL
pH: 5.5 (ref 5.0–8.0)

## 2012-06-19 LAB — CBC WITH DIFFERENTIAL/PLATELET
Basophils Absolute: 0 10*3/uL (ref 0.0–0.1)
Eosinophils Relative: 3 % (ref 0–5)
HCT: 41.5 % (ref 39.0–52.0)
Lymphocytes Relative: 13 % (ref 12–46)
MCV: 83.5 fL (ref 78.0–100.0)
Monocytes Absolute: 0.7 10*3/uL (ref 0.1–1.0)
RDW: 15.2 % (ref 11.5–15.5)
WBC: 7.4 10*3/uL (ref 4.0–10.5)

## 2012-06-19 MED ORDER — ONDANSETRON HCL 4 MG/2ML IJ SOLN
4.0000 mg | Freq: Once | INTRAMUSCULAR | Status: AC
  Administered 2012-06-19: 4 mg via INTRAVENOUS
  Filled 2012-06-19: qty 2

## 2012-06-19 MED ORDER — HYDROMORPHONE HCL PF 1 MG/ML IJ SOLN
1.0000 mg | Freq: Once | INTRAMUSCULAR | Status: AC
  Administered 2012-06-19: 1 mg via INTRAVENOUS
  Filled 2012-06-19: qty 1

## 2012-06-19 MED ORDER — IOHEXOL 300 MG/ML  SOLN
25.0000 mL | Freq: Once | INTRAMUSCULAR | Status: AC | PRN
  Administered 2012-06-19: 25 mL via ORAL

## 2012-06-19 MED ORDER — SODIUM CHLORIDE 0.9 % IV BOLUS (SEPSIS)
1000.0000 mL | Freq: Once | INTRAVENOUS | Status: AC
  Administered 2012-06-19: 1000 mL via INTRAVENOUS

## 2012-06-19 MED ORDER — PANTOPRAZOLE SODIUM 40 MG PO TBEC: 40.0000 mg | DELAYED_RELEASE_TABLET | Freq: Every day | ORAL | Status: DC

## 2012-06-19 NOTE — ED Notes (Signed)
Pt reports abd pain that started this am, pt reports "feeling gassy and bloated." denies any n/v/d. No acute distress noted at triage.

## 2012-06-19 NOTE — ED Notes (Signed)
PT comfortable with d/c and f/u instructions. Prescriptions x1 

## 2012-06-19 NOTE — ED Notes (Signed)
Patient finished contrast drink.

## 2012-06-19 NOTE — ED Notes (Signed)
Ct notified pt finished contrast.  

## 2012-06-19 NOTE — ED Notes (Signed)
Pt returned from CT °

## 2012-06-19 NOTE — ED Provider Notes (Signed)
History     CSN: [id number]  Arrival date & time 06/19/12  1659   First MD Initiated Contact with Patient 06/19/12 1916      Chief Complaint  Patient presents with  . Abdominal Pain    (Consider location/radiation/quality/duration/timing/severity/associated sxs/prior treatment) Patient is a 47 y.o. male presenting with abdominal pain.  Abdominal Pain Associated symptoms include abdominal pain.   Pt with history of diverticulitis reports vague epigastric pain worsening throughout the day, not associated with nausea, vomiting or diarrhea although he has not had any appetite. He reports he tried various OTC and home remedies to help his symptoms without improvement. He reports he has had a normal BM today. No dysuria or hematuria.   Past Medical History  Diagnosis Date  . Diverticulitis   . BMI 34.0-34.9,adult 08/26/2011    History reviewed. No pertinent past surgical history.  History reviewed. No pertinent family history.  History  Substance Use Topics  . Smoking status: Never Smoker   . Smokeless tobacco: Not on file  . Alcohol Use: No      Review of Systems  Gastrointestinal: Positive for abdominal pain.   All other systems reviewed and are negative except as noted in HPI.   Allergies  Review of patient's allergies indicates no known allergies.  Home Medications  No current outpatient prescriptions on file.  BP 125/95  Pulse 57  Temp(Src) 98.4 F (36.9 C)  Resp 24  SpO2 98%  Physical Exam  Nursing note and vitals reviewed. Constitutional: He is oriented to person, place, and time. He appears well-developed and well-nourished.  HENT:  Head: Normocephalic and atraumatic.  Eyes: EOM are normal. Pupils are equal, round, and reactive to light.  Neck: Normal range of motion. Neck supple.  Cardiovascular: Normal rate, normal heart sounds and intact distal pulses.   Pulmonary/Chest: Effort normal and breath sounds normal.  Abdominal: Soft. Bowel sounds are  normal. He exhibits no distension. There is tenderness (RLQ). There is guarding (RLQ). There is no rebound.  Musculoskeletal: Normal range of motion. He exhibits no edema and no tenderness.  Neurological: He is alert and oriented to person, place, and time. He has normal strength. No cranial nerve deficit or sensory deficit.  Skin: Skin is warm and dry. No rash noted.  Psychiatric: He has a normal mood and affect.    ED Course  Procedures (including critical care time)  Labs Reviewed  COMPREHENSIVE METABOLIC PANEL - Abnormal; Notable for the following:    GFR calc non Af Amer 73 (*)    GFR calc Af Amer 84 (*)    All other components within normal limits  LIPASE, BLOOD  CBC WITH DIFFERENTIAL  URINALYSIS, ROUTINE W REFLEX MICROSCOPIC   Ct Abdomen Pelvis W Contrast  06/19/2012   *RADIOLOGY REPORT*  Clinical Data: Mid upper and right lower quadrant abdominal pain. History of diverticulitis.  CT ABDOMEN AND PELVIS WITH CONTRAST  Technique:  Multidetector CT imaging of the abdomen and pelvis was performed following the standard protocol during bolus administration of intravenous contrast.  Contrast:  100 ml Omnipaque-300  Comparison: 08/23/2011.  Findings: Normal appearing liver, spleen, pancreas, gallbladder, adrenal glands, kidneys and urinary bladder.  Mildly enlarged prostate gland with mild calcification.  No gastrointestinal abnormalities or enlarged lymph nodes.  Normal appendix.  Minimal atelectasis at both lung bases.  Minimal lumbar and lower thoracic spine degenerative changes.  IMPRESSION: No acute abnormality.  No evidence of appendicitis.   Original Report Authenticated By: Beckie Salts, M.D.  1. Gastritis       MDM  Labs ordered in triage unremarkable. Pt reports epigastric pain, but exam reveals tenderness and guarding in the RLQ. Will send for CT to rule out appendicitis or less likely diverticulitis.  10:08 PM CT as above normal. Pt sleeping comfortably. No further  pain, abdomen benign. Will d/c with PPI for possible gastritis vs PUD. PCP followup for GI referral if symptoms continue.        Trilby Way B. Bernette Mayers, MD 06/19/12 2209

## 2015-08-19 ENCOUNTER — Emergency Department (HOSPITAL_COMMUNITY): Payer: BC Managed Care – PPO

## 2015-08-19 ENCOUNTER — Emergency Department (HOSPITAL_COMMUNITY)
Admission: EM | Admit: 2015-08-19 | Discharge: 2015-08-19 | Disposition: A | Discharge status: Home / Self Care | Payer: BC Managed Care – PPO | Attending: Emergency Medicine | Admitting: Emergency Medicine

## 2015-08-19 ENCOUNTER — Ambulatory Visit (HOSPITAL_COMMUNITY)
Admission: EM | Admit: 2015-08-19 | Discharge: 2015-08-19 | Disposition: A | Discharge status: Nursing Home (Includes ICF) | Payer: BC Managed Care – PPO

## 2015-08-19 ENCOUNTER — Encounter (HOSPITAL_COMMUNITY): Payer: Self-pay | Admitting: Emergency Medicine

## 2015-08-19 DIAGNOSIS — K5730 Diverticulosis of large intestine without perforation or abscess without bleeding: Secondary | ICD-10-CM | POA: Diagnosis not present

## 2015-08-19 DIAGNOSIS — K5732 Diverticulitis of large intestine without perforation or abscess without bleeding: Secondary | ICD-10-CM | POA: Diagnosis not present

## 2015-08-19 DIAGNOSIS — R109 Unspecified abdominal pain: Secondary | ICD-10-CM | POA: Diagnosis present

## 2015-08-19 LAB — COMPREHENSIVE METABOLIC PANEL
ALT: 27 U/L (ref 17–63)
AST: 27 U/L (ref 15–41)
Albumin: 4.1 g/dL (ref 3.5–5.0)
Alkaline Phosphatase: 45 U/L (ref 38–126)
Anion gap: 8 (ref 5–15)
BILIRUBIN TOTAL: 1.4 mg/dL — AB (ref 0.3–1.2)
BUN: 12 mg/dL (ref 6–20)
CHLORIDE: 99 mmol/L — AB (ref 101–111)
CO2: 26 mmol/L (ref 22–32)
CREATININE: 1.34 mg/dL — AB (ref 0.61–1.24)
Calcium: 9 mg/dL (ref 8.9–10.3)
Glucose, Bld: 84 mg/dL (ref 65–99)
Potassium: 3.6 mmol/L (ref 3.5–5.1)
Sodium: 133 mmol/L — ABNORMAL LOW (ref 135–145)
TOTAL PROTEIN: 7.5 g/dL (ref 6.5–8.1)

## 2015-08-19 LAB — CBC
HCT: 43.9 % (ref 39.0–52.0)
Hemoglobin: 14.3 g/dL (ref 13.0–17.0)
MCH: 27.9 pg (ref 26.0–34.0)
MCHC: 32.6 g/dL (ref 30.0–36.0)
MCV: 85.7 fL (ref 78.0–100.0)
PLATELETS: 208 10*3/uL (ref 150–400)
RBC: 5.12 MIL/uL (ref 4.22–5.81)
RDW: 14.2 % (ref 11.5–15.5)
WBC: 11.3 10*3/uL — AB (ref 4.0–10.5)

## 2015-08-19 LAB — URINALYSIS, ROUTINE W REFLEX MICROSCOPIC
Bilirubin Urine: NEGATIVE
GLUCOSE, UA: NEGATIVE mg/dL
Hgb urine dipstick: NEGATIVE
KETONES UR: NEGATIVE mg/dL
LEUKOCYTES UA: NEGATIVE
NITRITE: NEGATIVE
PROTEIN: NEGATIVE mg/dL
Specific Gravity, Urine: 1.025 (ref 1.005–1.030)
pH: 5.5 (ref 5.0–8.0)

## 2015-08-19 LAB — LIPASE, BLOOD: LIPASE: 16 U/L (ref 11–51)

## 2015-08-19 MED ORDER — METRONIDAZOLE 500 MG PO TABS
500.0000 mg | ORAL_TABLET | Freq: Once | ORAL | Status: AC
  Administered 2015-08-19: 500 mg via ORAL
  Filled 2015-08-19: qty 1

## 2015-08-19 MED ORDER — ONDANSETRON HCL 4 MG PO TABS: 4.0000 mg | ORAL_TABLET | Freq: Four times a day (QID) | ORAL | 0 refills | Status: DC

## 2015-08-19 MED ORDER — SODIUM CHLORIDE 0.9 % IV BOLUS (SEPSIS)
1000.0000 mL | Freq: Once | INTRAVENOUS | Status: AC
  Administered 2015-08-19: 1000 mL via INTRAVENOUS

## 2015-08-19 MED ORDER — IOPAMIDOL (ISOVUE-300) INJECTION 61%
INTRAVENOUS | Status: AC
  Administered 2015-08-19: 100 mL
  Filled 2015-08-19: qty 100

## 2015-08-19 MED ORDER — CIPROFLOXACIN HCL 500 MG PO TABS: 500.0000 mg | ORAL_TABLET | Freq: Two times a day (BID) | ORAL | 0 refills | Status: DC

## 2015-08-19 MED ORDER — CIPROFLOXACIN HCL 500 MG PO TABS
500.0000 mg | ORAL_TABLET | Freq: Once | ORAL | Status: AC
  Administered 2015-08-19: 500 mg via ORAL
  Filled 2015-08-19: qty 1

## 2015-08-19 MED ORDER — HYDROCODONE-ACETAMINOPHEN 5-325 MG PO TABS: 1.0000 | ORAL_TABLET | ORAL | 0 refills | Status: DC | PRN

## 2015-08-19 MED ORDER — METRONIDAZOLE 500 MG PO TABS: 500.0000 mg | ORAL_TABLET | Freq: Two times a day (BID) | ORAL | 0 refills | Status: DC

## 2015-08-19 NOTE — ED Notes (Signed)
Report called to Mellody DanceKeith, ED Triage RN.

## 2015-08-19 NOTE — ED Triage Notes (Signed)
Pt. Stated, I started having stomach pain lower center.

## 2015-08-19 NOTE — ED Triage Notes (Signed)
Pt c/o intermittent abd pain onset yest associated nausea... Reports hx of diverticulitis... LBM = 8/10  A&O x4... NAD

## 2015-08-19 NOTE — ED Provider Notes (Signed)
[facility]   CSN: [id number] Arrival date & time: 08/19/15  1329  First Provider Contact:  None       History   Chief Complaint Chief Complaint  Patient presents with  . Bloated  . Abdominal Pain    HPI Lance Fisher is a 50 y.o. male.  Pt has a hx of diverticulitis and presents to the ED today with possible diverticulitis.  The pt said that [facility].  The pt said that he had some nausea last night, but none now.  He currently does not want any pain meds.      Past Medical History:  Diagnosis Date  . BMI 34.0-34.9,adult 08/26/2011  . Diverticulitis     Patient Active Problem List   Diagnosis Date Noted  . Sigmoid diverticulitis 09/11/2011  . Diverticulosis, acute 08/26/2011  . BMI 34.0-34.9,adult 08/26/2011    History reviewed. No pertinent surgical history.     Home Medications    Prior to Admission medications   Medication Sig Start Date End Date Taking? Authorizing Provider  ciprofloxacin (CIPRO) 500 MG tablet Take 1 tablet (500 mg total) by mouth every 12 (twelve) hours. 08/19/15   Jacalyn LefevreJulie Adelie Croswell, MD  HYDROcodone-acetaminophen (NORCO/VICODIN) 5-325 MG tablet Take 1 tablet by mouth every 4 (four) hours as needed. 08/19/15   Jacalyn LefevreJulie Sebastain Fishbaugh, MD  metroNIDAZOLE (FLAGYL) 500 MG tablet Take 1 tablet (500 mg total) by mouth 2 (two) times daily. 08/19/15   Jacalyn LefevreJulie Leander Tout, MD  ondansetron (ZOFRAN) 4 MG tablet Take 1 tablet (4 mg total) by mouth every 6 (six) hours. 08/19/15   Jacalyn LefevreJulie Desjuan Stearns, MD    Family History No family history on file.  Social History Social History  Substance Use Topics  . Smoking status: Never Smoker  . Smokeless tobacco: Never Used  . Alcohol use No     Allergies   Review of patient's allergies indicates no known allergies.   Review of Systems Review of Systems  Gastrointestinal: Positive for abdominal pain and nausea.  All other systems reviewed and are  negative.    Physical Exam Updated Vital Signs BP 108/72   Pulse (!) 56   Temp 98.5 F (36.9 C) (Oral)   Resp 16   Ht 5\' 8"  (1.727 m)   Wt 218 lb 9 oz (99.1 kg)   SpO2 100%   BMI 33.23 kg/m   Physical Exam  Constitutional: He is oriented to person, place, and time. He appears well-developed and well-nourished.  HENT:  Head: Normocephalic and atraumatic.  Right Ear: External ear normal.  Left Ear: External ear normal.  Nose: Nose normal.  Mouth/Throat: Oropharynx is clear and moist.  Eyes: Conjunctivae and EOM are normal. Pupils are equal, round, and reactive to light.  Neck: Normal range of motion. Neck supple.  Cardiovascular: Normal rate, regular rhythm, normal heart sounds and intact distal pulses.   Pulmonary/Chest: Effort normal and breath sounds normal.  Abdominal: Soft. Normal appearance, normal aorta and bowel sounds are normal. There is tenderness in the suprapubic area and left lower quadrant.  Musculoskeletal: Normal range of motion.  Neurological: He is alert and oriented to person, place, and time.  Skin: Skin is warm and dry.  Psychiatric: He has a normal mood and affect. His behavior is normal. Judgment and thought content normal.  Nursing note and vitals reviewed.    ED Treatments / Results  Labs (all labs ordered are listed, but only abnormal results are displayed) Labs Reviewed  COMPREHENSIVE METABOLIC PANEL - Abnormal; Notable for the following:       Result Value   Sodium 133 (*)    Chloride 99 (*)    Creatinine, Ser 1.34 (*)    Total Bilirubin 1.4 (*)    All other components within normal limits  CBC - Abnormal; Notable for the following:    WBC 11.3 (*)    All other components within normal limits  LIPASE, BLOOD  URINALYSIS, ROUTINE W REFLEX MICROSCOPIC (NOT AT [facility])    EKG  EKG Interpretation None       Radiology Ct Abdomen Pelvis W Contrast  Result Date: 08/19/2015 CLINICAL DATA:  Lower abdominal pain since yesterday, LEFT  lower quadrant pain, question diverticulitis EXAM: CT ABDOMEN AND PELVIS WITH CONTRAST TECHNIQUE: Multidetector CT imaging of the abdomen and pelvis was performed using the standard protocol following bolus administration of intravenous contrast. Sagittal and coronal MPR images reconstructed from axial data set. CONTRAST:  ISOVUE-300 IOPAMIDOL (ISOVUE-300) INJECTION 61% IV. Oral contrast was not administered. COMPARISON:  06/19/2012 FINDINGS: Lower chest:  Lung bases clear Hepatobiliary: Gallbladder and liver normal appearance Pancreas: Normal appearance Spleen: Normal appearance, small splenules noted Adrenals/Urinary Tract: Adrenal glands normal appearance. Kidneys, ureters, bladder, and prostate gland normal appearance Stomach/Bowel: Normal appendix. Diverticulosis of descending and sigmoid colon with segmental wall thickening in the sigmoid colon associated with pericolic infiltrative changes compatible with acute diverticulitis. Stranding of mesocolon without discrete abscess collection. No definite extraluminal gas or free intraperitoneal air. Stomach and remaining bowel loops normal appearance. Vascular/Lymphatic: Aorta normal caliber.  No adenopathy. Reproductive: N/A Other: No free air or free fluid. Musculoskeletal: Unremarkable IMPRESSION: Sigmoid diverticulitis without evidence of perforation or abscess. Electronically Signed   By: Ulyses Southward M.D.   On: 08/19/2015 16:06    Procedures Procedures (including critical care time)  Medications Ordered in ED Medications  sodium chloride 0.9 % bolus 1,000 mL (0 mLs Intravenous Stopped 08/19/15 1644)  iopamidol (ISOVUE-300) 61 % injection (100 mLs  Contrast Given 08/19/15 1537)  ciprofloxacin (CIPRO) tablet 500 mg (500 mg Oral Given 08/19/15 1634)  metroNIDAZOLE (FLAGYL) tablet 500 mg (500 mg Oral Given 08/19/15 1634)     Initial Impression / Assessment and Plan / ED Course  I have reviewed the triage vital signs and the nursing  notes.  Pertinent labs & imaging results that were available during my care of the patient were reviewed by me and considered in my medical decision making (see chart for details).  Clinical Course   Pain still tolerable in the ED.  Pt does not want anything for pain in the ED.  Final Clinical Impressions(s) / ED Diagnoses   Final diagnoses:  Diverticulitis of large intestine without perforation or abscess without bleeding    New Prescriptions Discharge Medication List as of 08/19/2015  3:58 PM    START taking these medications   Details  ciprofloxacin (CIPRO) 500 MG tablet Take 1 tablet (500 mg total) by mouth every 12 (twelve) hours., Starting Sat 08/19/2015, Print    HYDROcodone-acetaminophen (NORCO/VICODIN) 5-325 MG tablet Take 1 tablet by mouth every 4 (four) hours as needed., Starting Sat 08/19/2015, Print    metroNIDAZOLE (FLAGYL) 500 MG tablet Take 1 tablet (500 mg total) by mouth 2 (two) times daily., Starting Sat 08/19/2015, Print    ondansetron (ZOFRAN) 4 MG tablet Take 1 tablet (4 mg total) by mouth every 6 (six) hours., Starting Sat 08/19/2015, Print         Jacalyn Lefevre, MD 08/20/15  0700  

## 2015-08-19 NOTE — ED Provider Notes (Signed)
Signed out by Dr Particia NearingHaviland at 1600 that suspected diverticulitis, d/c instructions are done, to d/c to home when ct confirms diverticulitis.   Ct c/w diverticulitis. Pt comfortable. No distress.  Vitals:   08/19/15 1354 08/19/15 1449  BP: 124/81 130/86  Pulse: 65 67  Resp: 17 16  Temp: 98.5 F (36.9 C)    Patient currently appears stable for d/c.      Cathren LaineKevin Toula Miyasaki, MD 08/19/15 1639

## 2015-08-19 NOTE — ED Provider Notes (Signed)
[facility]    CSN: [id number] Arrival date & time: 08/19/15  1232  First Provider Contact:  First MD Initiated Contact with Patient 08/19/15 1248        History   Chief Complaint Chief Complaint  Patient presents with  . Abdominal Pain    HPI Lance Fisher is a 50 y.o. male.   The history is provided by the patient.  Abdominal Pain  Pain location:  Suprapubic and LLQ Pain quality: cramping   Pain radiates to:  Does not radiate Pain severity:  Mild Onset quality:  Gradual Duration:  1 day Progression:  Unchanged Chronicity:  Recurrent Context: recent travel   Context comment:  H/o divert in 2014, concern for similar sx. Relieved by:  Nothing Worsened by:  Nothing Ineffective treatments:  None tried Associated symptoms: constipation and nausea   Associated symptoms: no diarrhea, no dysuria, no fever, no melena and no vomiting   Associated symptoms comment:  No bm since thurs, 1 episode of nausea.   Past Medical History:  Diagnosis Date  . BMI 34.0-34.9,adult 08/26/2011  . Diverticulitis     Patient Active Problem List   Diagnosis Date Noted  . Sigmoid diverticulitis 09/11/2011  . Diverticulosis, acute 08/26/2011  . BMI 34.0-34.9,adult 08/26/2011    History reviewed. No pertinent surgical history.     Home Medications    Prior to Admission medications   Medication Sig Start Date End Date Taking? Authorizing Provider  pantoprazole (PROTONIX) 40 MG tablet Take 1 tablet (40 mg total) by mouth daily. 06/19/12   Susy Frizzle, MD    Family History History reviewed. No pertinent family history.  Social History Social History  Substance Use Topics  . Smoking status: Never Smoker  . Smokeless tobacco: Never Used  . Alcohol use No     Allergies   Review of patient's allergies indicates no known allergies.   Review of Systems Review of Systems  Constitutional: Negative.  Negative for fever.  Gastrointestinal: Positive for abdominal  pain, constipation and nausea. Negative for blood in stool, diarrhea, melena and vomiting.  Genitourinary: Negative.  Negative for dysuria.  All other systems reviewed and are negative.    Physical Exam Triage Vital Signs ED Triage Vitals  Enc Vitals Group     BP 08/19/15 1251 131/81     Pulse Rate 08/19/15 1251 69     Resp 08/19/15 1251 18     Temp 08/19/15 1251 98 F (36.7 C)     Temp Source 08/19/15 1251 Oral     SpO2 08/19/15 1251 97 %     Weight --      Height --      Head Circumference --      Peak Flow --      Pain Score 08/19/15 1252 4     Pain Loc --      Pain Edu? --      Excl. in GC? --    No data found.   Updated Vital Signs BP 131/81 (BP Location: Left Arm)   Pulse 69   Temp 98 F (36.7 C) (Oral)   Resp 18   SpO2 97%   Visual Acuity Right Eye Distance:   Left Eye Distance:   Bilateral Distance:    Right Eye Near:   Left Eye Near:    Bilateral Near:     Physical Exam  Constitutional: He is oriented to person, place, and time. He appears well-developed and well-nourished. No distress.  Abdominal: Soft. He  exhibits no mass. Bowel sounds are decreased. There is tenderness in the suprapubic area. There is no rebound and no guarding. No hernia.    Neurological: He is alert and oriented to person, place, and time.  Skin: Skin is warm and dry.  Nursing note and vitals reviewed.    UC Treatments / Results  Labs (all labs ordered are listed, but only abnormal results are displayed) Labs Reviewed - No data to display  EKG  EKG Interpretation None       Radiology No results found.  Procedures Procedures (including critical care time)  Medications Ordered in UC Medications - No data to display   Initial Impression / Assessment and Plan / UC Course  I have reviewed the triage vital signs and the nursing notes.  Pertinent labs & imaging results that were available during my care of the patient were reviewed by me and considered in my  medical decision making (see chart for details).  Clinical Course   Sent for eval of lower abd pain, likely diverticulitis  Final Clinical Impressions(s) / UC Diagnoses   Final diagnoses:  None    New Prescriptions New Prescriptions   No medications on file     Linna HoffJames D Lynze Reddy, MD 08/19/15 1303

## 2015-10-01 ENCOUNTER — Emergency Department (HOSPITAL_COMMUNITY): Payer: BC Managed Care – PPO

## 2015-10-01 ENCOUNTER — Emergency Department (HOSPITAL_COMMUNITY)
Admission: EM | Admit: 2015-10-01 | Discharge: 2015-10-01 | Disposition: A | Discharge status: Home / Self Care | Payer: BC Managed Care – PPO | Attending: Emergency Medicine | Admitting: Emergency Medicine

## 2015-10-01 ENCOUNTER — Encounter (HOSPITAL_COMMUNITY): Payer: Self-pay | Admitting: *Deleted

## 2015-10-01 DIAGNOSIS — R451 Restlessness and agitation: Secondary | ICD-10-CM | POA: Diagnosis present

## 2015-10-01 DIAGNOSIS — Z5181 Encounter for therapeutic drug level monitoring: Secondary | ICD-10-CM | POA: Diagnosis not present

## 2015-10-01 DIAGNOSIS — J069 Acute upper respiratory infection, unspecified: Secondary | ICD-10-CM

## 2015-10-01 DIAGNOSIS — F419 Anxiety disorder, unspecified: Secondary | ICD-10-CM | POA: Insufficient documentation

## 2015-10-01 DIAGNOSIS — R443 Hallucinations, unspecified: Secondary | ICD-10-CM | POA: Diagnosis not present

## 2015-10-01 LAB — CBC WITH DIFFERENTIAL/PLATELET
Basophils Absolute: 0 10*3/uL (ref 0.0–0.1)
Basophils Relative: 0 %
EOS ABS: 0.1 10*3/uL (ref 0.0–0.7)
Eosinophils Relative: 1 %
HCT: 44.1 % (ref 39.0–52.0)
HEMOGLOBIN: 14.4 g/dL (ref 13.0–17.0)
LYMPHS ABS: 1.2 10*3/uL (ref 0.7–4.0)
Lymphocytes Relative: 13 %
MCH: 28.2 pg (ref 26.0–34.0)
MCHC: 32.7 g/dL (ref 30.0–36.0)
MCV: 86.5 fL (ref 78.0–100.0)
MONO ABS: 0.7 10*3/uL (ref 0.1–1.0)
MONOS PCT: 7 %
NEUTROS PCT: 79 %
Neutro Abs: 7.5 10*3/uL (ref 1.7–7.7)
Platelets: 217 10*3/uL (ref 150–400)
RBC: 5.1 MIL/uL (ref 4.22–5.81)
RDW: 14.6 % (ref 11.5–15.5)
WBC: 9.5 10*3/uL (ref 4.0–10.5)

## 2015-10-01 LAB — COMPREHENSIVE METABOLIC PANEL
ALBUMIN: 4.7 g/dL (ref 3.5–5.0)
ALK PHOS: 49 U/L (ref 38–126)
ALT: 74 U/L — AB (ref 17–63)
ANION GAP: 9 (ref 5–15)
AST: 68 U/L — AB (ref 15–41)
BUN: 12 mg/dL (ref 6–20)
CALCIUM: 9.4 mg/dL (ref 8.9–10.3)
CO2: 26 mmol/L (ref 22–32)
CREATININE: 1.33 mg/dL — AB (ref 0.61–1.24)
Chloride: 104 mmol/L (ref 101–111)
GFR calc Af Amer: >60 mL/min (ref >60)
GFR calc non Af Amer: >60 mL/min (ref >60)
GLUCOSE: 96 mg/dL (ref 65–99)
Potassium: 3.3 mmol/L — ABNORMAL LOW (ref 3.5–5.1)
SODIUM: 139 mmol/L (ref 135–145)
Total Bilirubin: 0.8 mg/dL (ref 0.3–1.2)
Total Protein: 8.3 g/dL — ABNORMAL HIGH (ref 6.5–8.1)

## 2015-10-01 LAB — RAPID URINE DRUG SCREEN, HOSP PERFORMED
Amphetamines: NOT DETECTED
BARBITURATES: NOT DETECTED
Benzodiazepines: NOT DETECTED
Cocaine: NOT DETECTED
OPIATES: NOT DETECTED
TETRAHYDROCANNABINOL: NOT DETECTED

## 2015-10-01 LAB — ETHANOL: Alcohol, Ethyl (B): <5 mg/dL (ref <5)

## 2015-10-01 NOTE — BH Assessment (Signed)
Assessment completed. Consulted Nira ConnJason Berry, NP who agrees that pt does not meet inpatient criteria and should be referred to outpatient. Informed Elson AreasLeslie K Sofia, PA-C and pt of the recommendation. Pt is agreeable to contacting his EAP to set up counseling services. Pt was also provided with a list of outpatient resources.

## 2015-10-01 NOTE — ED Triage Notes (Signed)
Pt presents with sinus problem and trouble sleeping lately.  Per friend report: pt had an episode at church in which pt was observed speaking rapidly and talking about things that did not make sense.  Pt reports an episode like his occurred in 1998 in which pt was hospitalized for.  Pt denies SI/HI/hallucinations.  Pt currently calm, cooperative with no abnormal speech or behavior noted.

## 2015-10-01 NOTE — ED Notes (Signed)
Pt cannot use restroom at this time, aware urine specimen is needed.  

## 2015-10-01 NOTE — ED Provider Notes (Signed)
[facility]-EMERGENCY DEPT Provider Note   CSN: [id number] Arrival date & time: 10/01/15  1350     History   Chief Complaint No chief complaint on file.   HPI Lance Fisher is a 50 y.o. male.  The history is provided by the patient. No language interpreter was used.  Mental Health Problem  Presenting symptoms: agitation, bizarre behavior, delusional, disorganized speech, disorganized thought process and hallucinations   Patient accompanied by:  Family member Onset quality:  Sudden Timing:  Constant Progression:  Waxing and waning Context: not alcohol use and not drug abuse   Relieved by:  Nothing Worsened by:  Nothing Ineffective treatments:  None tried Risk factors: no family hx of mental illness   Pt was sent in by NP at his church.  Pt began yelling during service.  The congregation initially thought he was excited by service. She reports pt proceeded to have a full psychotic episode, yelling, crying, talking irrationally.  She reports pt has a history of similar episode 10 years ago.  She advised family to bring pt here.  Pt reports he has been on a cleansing diet and juicing and thinks that caused his episode.  Pt calm and able to give history.  He is unaware of event.   Past Medical History:  Diagnosis Date  . BMI 34.0-34.9,adult 08/26/2011  . Diverticulitis     Patient Active Problem List   Diagnosis Date Noted  . Sigmoid diverticulitis 09/11/2011  . Diverticulosis, acute 08/26/2011  . BMI 34.0-34.9,adult 08/26/2011    History reviewed. No pertinent surgical history.     Home Medications    Prior to Admission medications   Medication Sig Start Date End Date Taking? Authorizing Provider  ciprofloxacin (CIPRO) 500 MG tablet Take 1 tablet (500 mg total) by mouth every 12 (twelve) hours. Patient not taking: Reported on 10/01/2015 08/19/15   Jacalyn LefevreJulie Haviland, MD  HYDROcodone-acetaminophen (NORCO/VICODIN) 5-325 MG tablet Take 1 tablet by mouth every 4 (four) hours as  needed. Patient not taking: Reported on 10/01/2015 08/19/15   Jacalyn LefevreJulie Haviland, MD  metroNIDAZOLE (FLAGYL) 500 MG tablet Take 1 tablet (500 mg total) by mouth 2 (two) times daily. Patient not taking: Reported on 10/01/2015 08/19/15   Jacalyn LefevreJulie Haviland, MD  ondansetron (ZOFRAN) 4 MG tablet Take 1 tablet (4 mg total) by mouth every 6 (six) hours. Patient not taking: Reported on 10/01/2015 08/19/15   Jacalyn LefevreJulie Haviland, MD    Family History No family history on file.  Social History Social History  Substance Use Topics  . Smoking status: Never Smoker  . Smokeless tobacco: Never Used  . Alcohol use No     Allergies   Review of patient's allergies indicates no known allergies.   Review of Systems Review of Systems  HENT: Positive for sinus pressure.   Respiratory: Positive for cough. Negative for shortness of breath.   Psychiatric/Behavioral: Positive for agitation and hallucinations.  All other systems reviewed and are negative.    Physical Exam Updated Vital Signs BP (!) 151/103   Pulse 70   Temp 97.8 F (36.6 C) (Oral)   Resp 18   SpO2 97%   Physical Exam  Constitutional: He appears well-developed and well-nourished.  HENT:  Head: Normocephalic and atraumatic.  Nasal congestion  Eyes: Conjunctivae are normal.  Neck: Neck supple.  Cardiovascular: Normal rate and regular rhythm.   No murmur heard. Pulmonary/Chest: Effort normal and breath sounds normal. No respiratory distress.  Abdominal: Soft. There is no tenderness.  Musculoskeletal: He exhibits no edema.  Neurological: He is alert.  Skin: Skin is warm and dry.  Psychiatric: He has a normal mood and affect.  Pt is alert, answers questions appropriately, pt does not seem manic, he is not psychotic.    Nursing note and vitals reviewed.    ED Treatments / Results  Labs (all labs ordered are listed, but only abnormal results are displayed) Labs Reviewed  COMPREHENSIVE METABOLIC PANEL - Abnormal; Notable for the  following:       Result Value   Potassium 3.3 (*)    Creatinine, Ser 1.33 (*)    Total Protein 8.3 (*)    AST 68 (*)    ALT 74 (*)    All other components within normal limits  ETHANOL  CBC WITH DIFFERENTIAL/PLATELET  URINE RAPID DRUG SCREEN, HOSP PERFORMED    EKG  EKG Interpretation None       Radiology Dg Chest 2 View  Result Date: 10/01/2015 CLINICAL DATA:  Sudden onset cough, sneezing, tremors, shortness of breath and chest pressure for 1 day since breathing in a seasoning packet for meat loaf. EXAM: CHEST  2 VIEW COMPARISON:  None. FINDINGS: The lungs are clear. Heart size is normal. No pneumothorax or pleural effusion. No bony abnormality. IMPRESSION: Negative chest. Electronically Signed   By: Drusilla Kanner M.D.   On: 10/01/2015 17:28    Procedures Procedures (including critical care time)  Medications Ordered in ED Medications - No data to display   Initial Impression / Assessment and Plan / ED Course  I have reviewed the triage vital signs and the nursing notes.  Pertinent labs & imaging results that were available during my care of the patient were reviewed by me and considered in my medical decision making (see chart for details).  Clinical Course    TTS assessed pt and advised pt to follow up outpatient.  Pt given resources.  He is advised to return if any problems.  Final Clinical Impressions(s) / ED Diagnoses   Final diagnoses:  URI, acute  Anxiety    New Prescriptions New Prescriptions   No medications on file  An After Visit Summary was printed and given to the patient.   Lonia Skinner [place], PA-C 10/01/15 2018    Raeford Razor, MD 10/01/15 [phone]

## 2015-10-01 NOTE — ED Triage Notes (Signed)
Pt has been calm and sleeping on and off since [facility]

## 2015-10-01 NOTE — Discharge Instructions (Signed)
Follow up for outpatient counseling and treatment.

## 2015-10-01 NOTE — BH Assessment (Signed)
Tele Assessment Note   Lance Fisher is an 50 y.o. male presenting to [facility] accompanied by his wife. 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 "It had me jittery and I was upset at church". "The nurse asked me to come to the hospital to get checked". "I just think I over did it". Pt reported that he ordered the detox from the [employer] morning show and he has been juicing. Pt also reported that he has only been eating one meal a day. Pt also reported that he was sneezing last night so he took some [place] and was unable to sleep last night. Pt denies SI, HI and AVH at this time. Pt did not report any previous suicide attempts or self-injurious behaviors.  Pt did not report  any depressive symptoms but shared that he his job transferred him to [employer] and now he has a larger class but he is unsure if it is stressful. Pt shared that he is developing a routine. No drugs or alcohol use reported.  No current mental health treatment. Pt reported that he was hospitalized in 1998 due to depression after the death of a family member. Pt is currently employed and reported that he has a good support system with includes his wife, church family and colleagues.  Collateral information was gathered from pt's wife Lance Fisher and church nurse, Vance Peper. Pt's wife reported that his sleep pattern has changed and he will go to bed late and get up early in the morning. She reported that he is sleeping 4 hours max. 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 She reported that the nurse in the church notice that he was getting hyper. She reported that last night pt was coughing and sneezing and he thought it was because of the seasoning she put in the food but she believes it was from the  seasoning. She shared that pt need something for balance.  Vance Peper (church nurse) reported that today while in service pt began crying and acting out of control (excited and jumping around). She stated "he had an episode". She reported that it is a psychological issues and he is not behaving normal, talking non-stop and talking about things not pertinent to what was going on. She reported that pt was talking about people that have passed away and worrying about dying. She reported that pt is manic and needs a mood stabilizer. Pt reported that pt is unable to recall the span of time when this was happening and she is concern. She reported that last Sunday pt had "an obvious break". She also shared that pt has not been sleeping.  Pt is not a danger to self or others. Pt does not meet inpatient criteria and it has been determined that outpatient therapy would be appropriate for managing pt's symptoms.    Diagnosis: Adjustment disorder, with anxiety   Past Medical History:  Past Medical History:  Diagnosis Date  . BMI 34.0-34.9,adult 08/26/2011  . Diverticulitis     History reviewed. No pertinent surgical history.  Family History: No family history on file.  Social History:  reports that he has never smoked. He has never used smokeless tobacco. He reports that he does not drink alcohol or use  drugs.  Additional Social History:  Alcohol / Drug Use History of alcohol / drug use?: No history of alcohol / drug abuse  CIWA: CIWA-Ar BP: (!) 158/103 Pulse Rate: 75 COWS:    PATIENT STRENGTHS: (choose at least two) Average or above average intelligence Capable of independent living Physical Health Religious Affiliation  Allergies: No Known Allergies  Home Medications:  (Not in a hospital admission)  OB/GYN Status:  No LMP for male patient.  General Assessment Data Location of Assessment: [facility] TTS Assessment: In system Is this a Tele or Face-to-Face Assessment?: Face-to-Face Is  this an Initial Assessment or a Re-assessment for this encounter?: Initial Assessment Marital status: Married Living Arrangements: Spouse/significant other, Children Can pt return to current living arrangement?: Yes Admission Status: Voluntary Is patient capable of signing voluntary admission?: Yes Referral Source: Self/Family/Friend Insurance type: BCBS     Crisis Care Plan Living Arrangements: Spouse/significant other, Children Name of Psychiatrist: No provider reported  Name of Therapist: No provider reported.   Education Status Is patient currently in school?: No Highest grade of school patient has completed: Masters   Risk to self with the past 6 months Suicidal Ideation: No Has patient been a risk to self within the past 6 months prior to admission? : No Suicidal Intent: No Has patient had any suicidal intent within the past 6 months prior to admission? : No Is patient at risk for suicide?: No Suicidal Plan?: No Has patient had any suicidal plan within the past 6 months prior to admission? : No Access to Means: No What has been your use of drugs/alcohol within the last 12 months?: No drug or alcohol use reported.  Previous Attempts/Gestures: No How many times?: 0 Other Self Harm Risks: Denies  Triggers for Past Attempts: None known (No previous attempts reported. ) Intentional Self Injurious Behavior: None Family Suicide History: No Recent stressful life event(s):  (Started a new job at [employer] ) Persecutory voices/beliefs?: No Depression: No Depression Symptoms:  (None reported. ) Substance abuse history and/or treatment for substance abuse?: No  Risk to Others within the past 6 months Homicidal Ideation: No Does patient have any lifetime risk of violence toward others beyond the six months prior to admission? : No Thoughts of Harm to Others: No Current Homicidal Intent: No Current Homicidal Plan: No Access to Homicidal Means: No Identified Victim:  N/A History of harm to others?: No Assessment of Violence: None Noted Violent Behavior Description: No violent behaviors observed. Pt is calm and cooperative at this time.  Does patient have access to weapons?: No Criminal Charges Pending?: No Does patient have a court date: No Is patient on probation?: No  Psychosis Hallucinations: None noted Delusions: None noted  Mental Status Report Appearance/Hygiene: Unremarkable Eye Contact: Good Motor Activity: Freedom of movement Speech: Logical/coherent Level of Consciousness: Quiet/awake Mood: Pleasant Affect: Appropriate to circumstance Anxiety Level: Minimal Thought Processes: Coherent, Relevant Judgement: Unimpaired Orientation: Person, Place, Time, Situation, Appropriate for developmental age Obsessive Compulsive Thoughts/Behaviors: None  Cognitive Functioning Concentration: Normal Memory: Recent Intact, Remote Intact IQ: Average Insight: Good Impulse Control: Good Appetite: Good Sleep: Decreased Total Hours of Sleep: 5 Vegetative Symptoms: None  ADLScreening [facility]) Patient's cognitive ability adequate to safely complete daily activities?: Yes Patient able to express need for assistance with ADLs?: Yes Independently performs ADLs?: Yes (appropriate for developmental age)  Prior Inpatient Therapy Prior Inpatient Therapy: Yes Prior Therapy Dates: 1998 Prior Therapy Facilty/Provider(s): [facility] Reason for Treatment: Depression   Prior Outpatient  Therapy Prior Outpatient Therapy: No Does patient have an ACCT team?: No Does patient have Intensive In-House Services?  : No Does patient have [facility] services? : No Does patient have P4CC services?: No  ADL Screening (condition at time of admission) Patient's cognitive ability adequate to safely complete daily activities?: Yes Is the patient deaf or have difficulty hearing?: No Does the patient have difficulty seeing, even when wearing glasses/contacts?:  No Does the patient have difficulty concentrating, remembering, or making decisions?: No Patient able to express need for assistance with ADLs?: Yes Does the patient have difficulty dressing or bathing?: No Independently performs ADLs?: Yes (appropriate for developmental age) Does the patient have difficulty walking or climbing stairs?: No       Abuse/Neglect Assessment (Assessment to be complete while patient is alone) Physical Abuse: Denies Verbal Abuse: Denies Sexual Abuse: Denies Exploitation of patient/patient's resources: Denies Self-Neglect: Denies     Merchant navy officer (For Healthcare) Does patient have an advance directive?: No Would patient like information on creating an advanced directive?: No - patient declined information    Additional Information 1:1 In Past 12 Months?: No CIRT Risk: No Elopement Risk: No Does patient have medical clearance?: Yes     Disposition:  Disposition Initial Assessment Completed for this Encounter: Yes Disposition of Patient: Outpatient treatment Type of outpatient treatment: Adult  Mariel Lukins S 10/01/2015 8:12 PM

## 2019-02-18 ENCOUNTER — Ambulatory Visit: Payer: BC Managed Care – PPO | Attending: Family

## 2019-02-18 DIAGNOSIS — Z23 Encounter for immunization: Secondary | ICD-10-CM | POA: Insufficient documentation

## 2019-02-18 NOTE — Progress Notes (Signed)
   Covid-19 Vaccination Clinic  Name:  Umer Harig    MRN: [id number] DOB: Jan 20, 1965  02/18/2019  Mr. Ruybal was observed post Covid-19 immunization for 15 minutes without incidence. He was provided with Vaccine Information Sheet and instruction to access the V-Safe system.   Mr. Vink was instructed to call 911 with any severe reactions post vaccine: Marland Kitchen Difficulty breathing  . Swelling of your face and throat  . A fast heartbeat  . A bad rash all over your body  . Dizziness and weakness    Immunizations Administered    Name Date Dose VIS Date Route   Moderna COVID-19 Vaccine 02/18/2019  3:56 PM 0.5 mL 12/08/2018 Intramuscular   Manufacturer: Moderna   Lot: [id number]   NDC: 73419-379-02

## 2019-03-23 ENCOUNTER — Ambulatory Visit: Payer: BC Managed Care – PPO | Attending: Family

## 2019-03-23 DIAGNOSIS — Z23 Encounter for immunization: Secondary | ICD-10-CM

## 2019-03-23 NOTE — Progress Notes (Signed)
   Covid-19 Vaccination Clinic  Name:  Lance Fisher    MRN: [id number] DOB: October 29, 1965  03/23/2019  Lance Fisher was observed post Covid-19 immunization for 15 minutes without incident. He was provided with Vaccine Information Sheet and instruction to access the V-Safe system.   Lance Fisher was instructed to call 911 with any severe reactions post vaccine: Marland Kitchen Difficulty breathing  . Swelling of face and throat  . A fast heartbeat  . A bad rash all over body  . Dizziness and weakness   Immunizations Administered    Name Date Dose VIS Date Route   Moderna COVID-19 Vaccine 03/23/2019  3:22 PM 0.5 mL 12/08/2018 Intramuscular   Manufacturer: Moderna   Lot: [id number]   NDC: 58592-924-46

## 2019-03-30 ENCOUNTER — Ambulatory Visit: Payer: BC Managed Care – PPO

## 2020-09-25 ENCOUNTER — Emergency Department (HOSPITAL_COMMUNITY)
Admission: EM | Admit: 2020-09-25 | Discharge: 2020-09-25 | Disposition: A | Discharge status: Home / Self Care | Payer: BC Managed Care – PPO | Attending: Emergency Medicine | Admitting: Emergency Medicine

## 2020-09-25 ENCOUNTER — Other Ambulatory Visit: Payer: Self-pay

## 2020-09-25 ENCOUNTER — Encounter (HOSPITAL_COMMUNITY): Payer: Self-pay | Admitting: Emergency Medicine

## 2020-09-25 DIAGNOSIS — F430 Acute stress reaction: Secondary | ICD-10-CM

## 2020-09-25 DIAGNOSIS — F439 Reaction to severe stress, unspecified: Secondary | ICD-10-CM | POA: Insufficient documentation

## 2020-09-25 DIAGNOSIS — R519 Headache, unspecified: Secondary | ICD-10-CM | POA: Diagnosis present

## 2020-09-25 NOTE — ED Provider Notes (Signed)
[facility]-EMERGENCY DEPT Provider Note   CSN: [id number] Arrival date & time: 09/25/20  0208     History Chief Complaint  Patient presents with   Headache   Panic Attack    Lance Fisher is a 55 y.o. male.  HPI He presents for evaluation of a period of crying and worry about financial stress.  He states he has not been paid at his job for the last month but looks forward to being.  At the end of this month.  He states after resting in the ED for several hours he feels better and has no concerns at this time.  He denies chest pain, palpitations, weakness or dizziness.  There are no other known active modifying factors.    Past Medical History:  Diagnosis Date   BMI 34.0-34.9,adult 08/26/2011   Diverticulitis     Patient Active Problem List   Diagnosis Date Noted   Sigmoid diverticulitis 09/11/2011   Diverticulosis, acute 08/26/2011   BMI 34.0-34.9,adult 08/26/2011    History reviewed. No pertinent surgical history.     No family history on file.  Social History   Tobacco Use   Smoking status: Never   Smokeless tobacco: Never  Substance Use Topics   Alcohol use: No   Drug use: No    Home Medications Prior to Admission medications   Medication Sig Start Date End Date Taking? Authorizing Provider  ciprofloxacin (CIPRO) 500 MG tablet Take 1 tablet (500 mg total) by mouth every 12 (twelve) hours. Patient not taking: Reported on 10/01/2015 08/19/15   Jacalyn Lefevre, MD  HYDROcodone-acetaminophen (NORCO/VICODIN) 5-325 MG tablet Take 1 tablet by mouth every 4 (four) hours as needed. Patient not taking: Reported on 10/01/2015 08/19/15   Jacalyn Lefevre, MD  metroNIDAZOLE (FLAGYL) 500 MG tablet Take 1 tablet (500 mg total) by mouth 2 (two) times daily. Patient not taking: Reported on 10/01/2015 08/19/15   Jacalyn Lefevre, MD  ondansetron (ZOFRAN) 4 MG tablet Take 1 tablet (4 mg total) by mouth every 6 (six) hours. Patient not taking: Reported on  10/01/2015 08/19/15   Jacalyn Lefevre, MD    Allergies    Patient has no known allergies.  Review of Systems   Review of Systems  All other systems reviewed and are negative.  Physical Exam Updated Vital Signs BP 135/88   Pulse 82   Temp 98.5 F (36.9 C) (Oral)   Resp 18   Ht 5\' 8"  (1.727 m)   Wt 99.8 kg   SpO2 99%   BMI 33.45 kg/m   Physical Exam Vitals and nursing note reviewed.  Constitutional:      General: He is not in acute distress.    Appearance: He is well-developed. He is not ill-appearing or toxic-appearing.  HENT:     Head: Normocephalic and atraumatic.     Right Ear: External ear normal.     Left Ear: External ear normal.  Eyes:     Conjunctiva/sclera: Conjunctivae normal.     Pupils: Pupils are equal, round, and reactive to light.  Neck:     Trachea: Phonation normal.  Cardiovascular:     Rate and Rhythm: Normal rate.     Heart sounds: Normal heart sounds.  Pulmonary:     Effort: Pulmonary effort is normal.  Abdominal:     General: There is no distension.     Palpations: Abdomen is soft.  Musculoskeletal:        General: No swelling. Normal range of motion.  Cervical back: Normal range of motion and neck supple.  Skin:    General: Skin is warm and dry.  Neurological:     Mental Status: He is alert and oriented to person, place, and time.     Cranial Nerves: No cranial nerve deficit.     Sensory: No sensory deficit.     Motor: No abnormal muscle tone.     Coordination: Coordination normal.  Psychiatric:        Mood and Affect: Mood normal.        Behavior: Behavior normal.        Thought Content: Thought content normal.        Judgment: Judgment normal.    ED Results / Procedures / Treatments   Labs (all labs ordered are listed, but only abnormal results are displayed) Labs Reviewed - No data to display  EKG EKG Interpretation  Date/Time:  Monday September 25 2020 03:06:11 EDT Ventricular Rate:  62 PR Interval:  167 QRS  Duration: 79 QT Interval:  408 QTC Calculation: 415 R Axis:   7 Text Interpretation: Sinus rhythm Normal ECG No previous ECGs available Confirmed by Paula Libra ([id number]) on 09/25/2020 3:15:11 AM  Radiology No results found.  Procedures Procedures   Medications Ordered in ED Medications - No data to display  ED Course  I have reviewed the triage vital signs and the nursing notes.  Pertinent labs & imaging results that were available during my care of the patient were reviewed by me and considered in my medical decision making (see chart for details).    MDM Rules/Calculators/A&P                            Patient Vitals for the past 24 hrs:  BP Temp Temp src Pulse Resp SpO2 Height Weight  09/25/20 0400 135/88 -- -- 82 18 99 % -- --  09/25/20 0301 (!) 153/98 98.5 F (36.9 C) Oral 61 20 98 % 5\' 8"  (1.727 m) 99.8 kg   At the time of discharge- reevaluation with update and discussion. After initial assessment and treatment, an updated evaluation reveals he is comfortable has no further complaints.  Findings discussed and questions answered.   Medical Decision Making:  This patient is presenting for evaluation of a period of tearfulness, which does require a range of treatment options, and is a complaint that involves a moderate risk of morbidity and mortality. The differential diagnoses include stress reaction, anxiety, psychiatric illness. I decided to review old records, and in summary millage male presenting with tearfulness after being concerned about his financial status.  After resting he has improved and feels like he is at his baseline.  I did not require additional historical information from anyone.    Critical Interventions-clinical evaluation, discussion with patient  After These Interventions, the Patient was reevaluated and was found stable for discharge.  Patient had improvement spontaneously after a period of observation and waiting in the emergency  department.  After discharge she is comfortable has no need for further intervention or desire to stay here for further treatment.  CRITICAL CARE-no Performed by: Mancel Bale  Nursing Notes Reviewed/ Care Coordinated Applicable Imaging Reviewed Interpretation of Laboratory Data incorporated into ED treatment  The patient appears reasonably screened and/or stabilized for discharge and I doubt any other medical condition or other [facility] requiring further screening, evaluation, or treatment in the ED at this time prior to discharge.  Plan: Home  Medications-continue; Home Treatments-rest, fluids; return here if the recommended treatment, does not improve the symptoms; Recommended follow up-PCP, PRN     Final Clinical Impression(s) / ED Diagnoses Final diagnoses:  Stress reaction    Rx / DC Orders ED Discharge Orders     None        Mancel Bale, MD 09/26/20 [phone]

## 2020-09-25 NOTE — Discharge Instructions (Addendum)
See your doctor as needed for problems 

## 2020-09-25 NOTE — ED Notes (Signed)
Patient noted to suddenly become tearful in room. Patient previously asleep in chair. Patient now quiet and back to sleep.

## 2020-09-25 NOTE — ED Triage Notes (Signed)
Patient arrives complaining of anxiety symptoms and a headache. Patient states he has been unable to pay some of his bills, which has increased his stress. 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 Patient states when he tries to sleep, he startles awake. Patient tearful in triage.

## 2020-09-26 ENCOUNTER — Other Ambulatory Visit: Payer: Self-pay

## 2020-09-26 ENCOUNTER — Emergency Department (HOSPITAL_BASED_OUTPATIENT_CLINIC_OR_DEPARTMENT_OTHER): Payer: BC Managed Care – PPO

## 2020-09-26 ENCOUNTER — Emergency Department (HOSPITAL_BASED_OUTPATIENT_CLINIC_OR_DEPARTMENT_OTHER)
Admission: EM | Admit: 2020-09-26 | Discharge: 2020-09-26 | Disposition: A | Discharge status: Home / Self Care | Payer: BC Managed Care – PPO | Attending: Emergency Medicine | Admitting: Emergency Medicine

## 2020-09-26 DIAGNOSIS — Z20822 Contact with and (suspected) exposure to covid-19: Secondary | ICD-10-CM | POA: Insufficient documentation

## 2020-09-26 DIAGNOSIS — R7989 Other specified abnormal findings of blood chemistry: Secondary | ICD-10-CM

## 2020-09-26 DIAGNOSIS — R079 Chest pain, unspecified: Secondary | ICD-10-CM | POA: Insufficient documentation

## 2020-09-26 DIAGNOSIS — R0602 Shortness of breath: Secondary | ICD-10-CM | POA: Diagnosis present

## 2020-09-26 DIAGNOSIS — R7401 Elevation of levels of liver transaminase levels: Secondary | ICD-10-CM | POA: Insufficient documentation

## 2020-09-26 LAB — CBC WITH DIFFERENTIAL/PLATELET
Abs Immature Granulocytes: 0.01 10*3/uL (ref 0.00–0.07)
Basophils Absolute: 0 10*3/uL (ref 0.0–0.1)
Basophils Relative: 0 %
Eosinophils Absolute: 0.3 10*3/uL (ref 0.0–0.5)
Eosinophils Relative: 5 %
HCT: 40 % (ref 39.0–52.0)
Hemoglobin: 13.2 g/dL (ref 13.0–17.0)
Immature Granulocytes: 0 %
Lymphocytes Relative: 27 %
Lymphs Abs: 1.5 10*3/uL (ref 0.7–4.0)
MCH: 28 pg (ref 26.0–34.0)
MCHC: 33 g/dL (ref 30.0–36.0)
MCV: 84.9 fL (ref 80.0–100.0)
Monocytes Absolute: 0.4 10*3/uL (ref 0.1–1.0)
Monocytes Relative: 7 %
Neutro Abs: 3.3 10*3/uL (ref 1.7–7.7)
Neutrophils Relative %: 61 %
Platelets: 198 10*3/uL (ref 150–400)
RBC: 4.71 MIL/uL (ref 4.22–5.81)
RDW: 14.6 % (ref 11.5–15.5)
WBC: 5.4 10*3/uL (ref 4.0–10.5)
nRBC: 0 % (ref 0.0–0.2)

## 2020-09-26 LAB — RESP PANEL BY RT-PCR (FLU A&B, COVID) ARPGX2
Influenza A by PCR: NEGATIVE
Influenza B by PCR: NEGATIVE
SARS Coronavirus 2 by RT PCR: NEGATIVE

## 2020-09-26 LAB — COMPREHENSIVE METABOLIC PANEL
ALT: 73 U/L — ABNORMAL HIGH (ref 0–44)
AST: 69 U/L — ABNORMAL HIGH (ref 15–41)
Albumin: 3.9 g/dL (ref 3.5–5.0)
Alkaline Phosphatase: 46 U/L (ref 38–126)
Anion gap: 5 (ref 5–15)
BUN: 10 mg/dL (ref 6–20)
CO2: 27 mmol/L (ref 22–32)
Calcium: 8.8 mg/dL — ABNORMAL LOW (ref 8.9–10.3)
Chloride: 104 mmol/L (ref 98–111)
Creatinine, Ser: 1.19 mg/dL (ref 0.61–1.24)
GFR, Estimated: >60 mL/min (ref >60)
Glucose, Bld: 134 mg/dL — ABNORMAL HIGH (ref 70–99)
Potassium: 3.4 mmol/L — ABNORMAL LOW (ref 3.5–5.1)
Sodium: 136 mmol/L (ref 135–145)
Total Bilirubin: 0.5 mg/dL (ref 0.3–1.2)
Total Protein: 7.3 g/dL (ref 6.5–8.1)

## 2020-09-26 LAB — TROPONIN I (HIGH SENSITIVITY): Troponin I (High Sensitivity): 8 ng/L (ref <18)

## 2020-09-26 NOTE — ED Triage Notes (Signed)
Sob. He was seen at [facility] yesterday for stress. His EKG was normal. His hands got clammy today and he felt like pins and needles.  EKG at triage.

## 2020-09-26 NOTE — Discharge Instructions (Addendum)
Follow-up with your doctor.  Your heart enzyme test is negative right now  If you have persistent chest pain please follow-up with cardiology.  Your COVID test just came back negative right now.  See your doctor for follow-up.  Your liver enzymes are slightly elevated and you can follow-up with your doctor about that and get repeat liver function test with your doctor.  Please avoid taking too much Tylenol or drink alcohol   Return to ER if you have worse chest pain, shortness of breath, numbness

## 2020-09-26 NOTE — ED Provider Notes (Signed)
[facility] EMERGENCY DEPARTMENT Provider Note   CSN: [id number] Arrival date & time: 09/26/20  1742     History Chief Complaint  Patient presents with   Shortness of Breath    Lance Fisher is a 55 y.o. male here with shortness of breath.  Patient was seen yesterday at the ED for stress reaction.  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 Patient states that he has some shortness of breath this morning and also slight chest pain.  He states that his bilateral arms went numb for several minutes. He came to the ED for evaluation.   The history is provided by the patient.      Past Medical History:  Diagnosis Date   BMI 34.0-34.9,adult 08/26/2011   Diverticulitis     Patient Active Problem List   Diagnosis Date Noted   Sigmoid diverticulitis 09/11/2011   Diverticulosis, acute 08/26/2011   BMI 34.0-34.9,adult 08/26/2011    No past surgical history on file.     No family history on file.  Social History   Tobacco Use   Smoking status: Never   Smokeless tobacco: Never  Substance Use Topics   Alcohol use: No   Drug use: No    Home Medications Prior to Admission medications   Medication Sig Start Date End Date Taking? Authorizing Provider  ciprofloxacin (CIPRO) 500 MG tablet Take 1 tablet (500 mg total) by mouth every 12 (twelve) hours. Patient not taking: No sig reported 08/19/15   Jacalyn Lefevre, MD  HYDROcodone-acetaminophen (NORCO/VICODIN) 5-325 MG tablet Take 1 tablet by mouth every 4 (four) hours as needed. Patient not taking: No sig reported 08/19/15   Jacalyn Lefevre, MD  metroNIDAZOLE (FLAGYL) 500 MG tablet Take 1 tablet (500 mg total) by mouth 2 (two) times daily. Patient not taking: No sig reported 08/19/15   Jacalyn Lefevre, MD  ondansetron (ZOFRAN) 4 MG tablet Take 1 tablet (4 mg total) by mouth every 6 (six) hours. Patient not taking: No sig reported 08/19/15   Jacalyn Lefevre, MD    Allergies    Patient  has no known allergies.  Review of Systems   Review of Systems  Respiratory:  Positive for shortness of breath.   All other systems reviewed and are negative.  Physical Exam Updated Vital Signs BP (!) 141/85   Pulse 76   Temp 98.5 F (36.9 C) (Oral)   Resp (!) 24   SpO2 100%   Physical Exam Vitals and nursing note reviewed.  Constitutional:      Appearance: He is well-developed.  HENT:     Head: Normocephalic.     Mouth/Throat:     Mouth: Mucous membranes are moist.  Eyes:     Extraocular Movements: Extraocular movements intact.     Pupils: Pupils are equal, round, and reactive to light.  Cardiovascular:     Rate and Rhythm: Normal rate and regular rhythm.  Pulmonary:     Effort: Pulmonary effort is normal.     Breath sounds: Normal breath sounds.     Comments: No wheezing or crackles  Abdominal:     General: Bowel sounds are normal.     Palpations: Abdomen is soft.  Musculoskeletal:        General: Normal range of motion.     Cervical back: Normal range of motion and neck supple.  Skin:    General: Skin is warm.     Capillary Refill: Capillary refill takes less than 2  seconds.  Neurological:     General: No focal deficit present.     Mental Status: He is alert and oriented to person, place, and time.     Comments: Nl sensation bilateral arms and legs. Nl strength bilateral bicep flexion and tricep extension. No facial droop.   Psychiatric:        Mood and Affect: Mood normal.        Behavior: Behavior normal.    ED Results / Procedures / Treatments   Labs (all labs ordered are listed, but only abnormal results are displayed) Labs Reviewed  COMPREHENSIVE METABOLIC PANEL - Abnormal; Notable for the following components:      Result Value   Potassium 3.4 (*)    Glucose, Bld 134 (*)    Calcium 8.8 (*)    AST 69 (*)    ALT 73 (*)    All other components within normal limits  RESP PANEL BY RT-PCR (FLU A&B, COVID) ARPGX2  CBC WITH DIFFERENTIAL/PLATELET   TROPONIN I (HIGH SENSITIVITY)  TROPONIN I (HIGH SENSITIVITY)    EKG EKG Interpretation  Date/Time:  Tuesday September 26 2020 17:51:58 EDT Ventricular Rate:  92 PR Interval:  166 QRS Duration: 84 QT Interval:  354 QTC Calculation: 437 R Axis:   35 Text Interpretation: sinus rhythm Anteroseptal infarct , age undetermined T wave abnormality, consider inferior ischemia Abnormal ECG No significant change since last tracing Confirmed by Richardean Canal [phone]) on 09/26/2020 10:08:59 PM  Radiology DG Chest 2 View  Result Date: 09/26/2020 CLINICAL DATA:  Chest pain and shortness of breath. EXAM: CHEST - 2 VIEW COMPARISON:  10/01/2015 FINDINGS: The heart size and mediastinal contours are within normal limits. Both lungs are clear. The visualized skeletal structures are unremarkable. IMPRESSION: No active cardiopulmonary disease. Electronically Signed   By: Burman Nieves M.D.   On: 09/26/2020 22:19    Procedures Procedures   Medications Ordered in ED Medications - No data to display  ED Course  I have reviewed the triage vital signs and the nursing notes.  Pertinent labs & imaging results that were available during my care of the patient were reviewed by me and considered in my medical decision making (see chart for details).    MDM Rules/Calculators/A&P                           Lance Fisher is a 55 y.o. male here with shortness of breath, arm numbness. Arm numbness resolved.  He was concerned for possible COVID.  Low suspicion for ACS.  I do not think he has a stroke at this point.  There is no neck pain to suggest cervical radiculopathy. Will test for COVID, get trop x 1, labs, CXR.   11:47 PM Labs are unremarkable and COVID test is negative.  Of note, LFTs slightly elevated but abdomen is nontender.  Patient denies drinking alcohol or using too much Tylenol.  Told him that this can be followed up outpatient.  Stable for discharge   Final Clinical Impression(s) / ED  Diagnoses Final diagnoses:  None    Rx / DC Orders ED Discharge Orders     None        Charlynne Pander, MD 09/26/20 [phone]

## 2020-09-26 NOTE — ED Notes (Signed)
Pt placed in a gown and hooked up to the monitor with the 5 lead, BP cuff and pulse ox 

## 2021-03-23 DIAGNOSIS — Z23 Encounter for immunization: Secondary | ICD-10-CM | POA: Diagnosis not present

## 2021-06-04 ENCOUNTER — Ambulatory Visit (HOSPITAL_COMMUNITY)
Admission: EM | Admit: 2021-06-04 | Discharge: 2021-06-04 | Disposition: A | Discharge status: Home / Self Care | Payer: BC Managed Care – PPO | Attending: Sports Medicine | Admitting: Sports Medicine

## 2021-06-04 ENCOUNTER — Encounter (HOSPITAL_COMMUNITY): Payer: Self-pay | Admitting: Emergency Medicine

## 2021-06-04 DIAGNOSIS — B353 Tinea pedis: Secondary | ICD-10-CM

## 2021-06-04 DIAGNOSIS — B351 Tinea unguium: Secondary | ICD-10-CM

## 2021-06-04 MED ORDER — FLUCONAZOLE 150 MG PO TABS: 150.0000 mg | ORAL_TABLET | ORAL | 0 refills | Status: AC

## 2021-06-04 MED ORDER — TERBINAFINE HCL 1 % EX CREA: 1.0000 "application " | TOPICAL_CREAM | Freq: Two times a day (BID) | CUTANEOUS | 0 refills | Status: DC

## 2021-06-04 NOTE — ED Triage Notes (Signed)
Right foot pinky toe swelling/pain x 1 month. Believes he got an infection from the [facility]. Has been using goldbond powder to keep it dry. Area in between toes has skin breakdown that appears white/yellow in color. Area warm. Denies hx of diabetes, worried for gout, joint space does not appear swollen

## 2021-06-04 NOTE — Discharge Instructions (Addendum)
Apply cream twice a day into all the folds of the toes.  Continue this until resolution of the fungal rash, then use for 1 more week  To help improve this more quickly, you also take fluconazole (Diflucan) by mouth. Take 1 tablet (150mg ) once weekly x 3 weeks.   I would recommend following up with your primary care physician regarding your nail discoloration, as this is more indicative of onychomycosis

## 2021-06-04 NOTE — ED Provider Notes (Signed)
[facility]    CSN: [id number] Arrival date & time: 06/04/21  7035      History   Chief Complaint Chief Complaint  Patient presents with   Foot Pain    HPI Lance Fisher is a 56 y.o. male here for left feet and little toe pain with substance between toes.   Foot Pain Pertinent negatives include no headaches.  Noticed white substance between toes, most notable b/w 4-5th toe No known injury Symptoms for 3 weeks Tried cutting out fish and meat but didn't help Did get seen by doctor - put on a one-time pill and that got better, but it came back. ? Unsure which medication/antibiotic  Has had pedicures before and this usually keeps the toes in-check but this time it remians. White/yellow skin brekdown between toes. No history of diabetes or gout before. He has been treating it with goldbond powder to try to keep dry but unsuccessful.   No fever/chills. No spreading.  Also reports his big toe is thickened and a different color. Has had this before.    Past Medical History:  Diagnosis Date   BMI 34.0-34.9,adult 08/26/2011   Diverticulitis     Patient Active Problem List   Diagnosis Date Noted   Sigmoid diverticulitis 09/11/2011   Diverticulosis, acute 08/26/2011   BMI 34.0-34.9,adult 08/26/2011    History reviewed. No pertinent surgical history.     Home Medications    Prior to Admission medications   Medication Sig Start Date End Date Taking? Authorizing Provider  fluconazole (DIFLUCAN) 150 MG tablet Take 1 tablet (150 mg total) by mouth once a week for 3 doses. 06/04/21 06/19/21 Yes Madelyn Brunner, DO  terbinafine (LAMISIL AT) 1 % cream Apply 1 application. topically 2 (two) times daily. 06/04/21  Yes Madelyn Brunner, DO  ciprofloxacin (CIPRO) 500 MG tablet Take 1 tablet (500 mg total) by mouth every 12 (twelve) hours. Patient not taking: No sig reported 08/19/15   Jacalyn Lefevre, MD  HYDROcodone-acetaminophen (NORCO/VICODIN) 5-325 MG tablet Take 1 tablet by  mouth every 4 (four) hours as needed. Patient not taking: No sig reported 08/19/15   Jacalyn Lefevre, MD  metroNIDAZOLE (FLAGYL) 500 MG tablet Take 1 tablet (500 mg total) by mouth 2 (two) times daily. Patient not taking: No sig reported 08/19/15   Jacalyn Lefevre, MD  ondansetron (ZOFRAN) 4 MG tablet Take 1 tablet (4 mg total) by mouth every 6 (six) hours. Patient not taking: No sig reported 08/19/15   Jacalyn Lefevre, MD    Family History History reviewed. No pertinent family history.  Social History Social History   Tobacco Use   Smoking status: Never   Smokeless tobacco: Never  Substance Use Topics   Alcohol use: No   Drug use: No     Allergies   Patient has no known allergies.   Review of Systems Review of Systems  Constitutional:  Negative for activity change, chills and fever.  Musculoskeletal:  Positive for arthralgias (toes, worse is 5th toe). Negative for joint swelling.  Skin:  Positive for color change and rash.  Neurological:  Negative for dizziness and headaches.  Hematological:  Does not bruise/bleed easily.    Physical Exam Triage Vital Signs ED Triage Vitals  Enc Vitals Group     BP 06/04/21 1100 (!) 113/58     Pulse Rate 06/04/21 1100 71     Resp 06/04/21 1100 16     Temp 06/04/21 1100 98.4 F (36.9 C)     Temp Source 06/04/21  1100 Oral     SpO2 06/04/21 1100 94 %     Weight --      Height --      Head Circumference --      Peak Flow --      Pain Score 06/04/21 1101 5     Pain Loc --      Pain Edu? --      Excl. in GC? --    No data found.  Updated Vital Signs BP (!) 113/58 (BP Location: Left Arm)   Pulse 71   Temp 98.4 F (36.9 C) (Oral)   Resp 16   SpO2 94%    Physical Exam Constitutional:      Appearance: Normal appearance.  HENT:     Head: Normocephalic and atraumatic.     Mouth/Throat:     Mouth: Mucous membranes are moist.     Pharynx: Oropharynx is clear.  Eyes:     Extraocular Movements: Extraocular movements intact.      Pupils: Pupils are equal, round, and reactive to light.  Pulmonary:     Effort: Pulmonary effort is normal. No respiratory distress.  Musculoskeletal:     Cervical back: Normal range of motion.     Right lower leg: No edema.     Left lower leg: No edema.     Comments: + Great toe bilaterally, L > R thickened and discolored  Skin:    General: Skin is warm.     Capillary Refill: Capillary refill takes less than 2 seconds.     Findings: Rash (+ skin breakdown and moist white-discharge like substance between toes 2-5. + Pruritic) present.  Neurological:     Mental Status: He is alert.  Psychiatric:        Mood and Affect: Mood normal.     UC Treatments / Results  Labs (all labs ordered are listed, but only abnormal results are displayed) Labs Reviewed - No data to display  EKG   Radiology No results found.  Procedures Procedures (including critical care time)  Medications Ordered in UC Medications - No data to display  Initial Impression / Assessment and Plan / UC Course  I have reviewed the triage vital signs and the nursing notes.  Pertinent labs & imaging results that were available during my care of the patient were reviewed by me and considered in my medical decision making (see chart for details).     Patient presents with 3-weeks of Tinea pedis of right foot. Keeping dry and Gold-bond powder not working. Reports he got a pill (likely anti-fungal?) as one-time thing from outside doctor that helped some, although returned. Will treat with Lamisil cream BID until resolution. To speed healing process with provide Diflucan 150mg  tablet once weekly x 3 doses. He also has onychomycosis of both great toes, mild-moderate. Discussed talking with PCP regarding this as treatment is longer term (3+ months) and requires lab monitoring, patient agreeable. Provided home-care and instructed on keeping toes dry for resolution. Patient safe for discharge home.   Final Clinical  Impressions(s) / UC Diagnoses   Final diagnoses:  Onychomycosis of great toe  Tinea pedis of right foot     Discharge Instructions      Apply cream twice a day into all the folds of the toes.  Continue this until resolution of the fungal rash, then use for 1 more week  To help improve this more quickly, you also take fluconazole (Diflucan) by mouth. Take 1 tablet (150mg ) once weekly  x 3 weeks.   I would recommend following up with your primary care physician regarding your nail discoloration, as this is more indicative of onychomycosis     ED Prescriptions     Medication Sig Dispense Auth. Provider   terbinafine (LAMISIL AT) 1 % cream Apply 1 application. topically 2 (two) times daily. 30 g Madelyn BrunnerBrooks, Birdia Jaycox, DO   fluconazole (DIFLUCAN) 150 MG tablet Take 1 tablet (150 mg total) by mouth once a week for 3 doses. 3 tablet Madelyn BrunnerBrooks, Karah Caruthers, DO      PDMP not reviewed this encounter.   Madelyn BrunnerBrooks, Lethia Donlon, DO 06/04/21 1418

## 2021-06-26 DIAGNOSIS — D120 Benign neoplasm of cecum: Secondary | ICD-10-CM | POA: Diagnosis not present

## 2021-06-26 DIAGNOSIS — Z1211 Encounter for screening for malignant neoplasm of colon: Secondary | ICD-10-CM | POA: Diagnosis not present

## 2021-06-26 DIAGNOSIS — K648 Other hemorrhoids: Secondary | ICD-10-CM | POA: Diagnosis not present

## 2021-06-26 DIAGNOSIS — K5730 Diverticulosis of large intestine without perforation or abscess without bleeding: Secondary | ICD-10-CM | POA: Diagnosis not present

## 2021-08-14 DIAGNOSIS — Z136 Encounter for screening for cardiovascular disorders: Secondary | ICD-10-CM | POA: Diagnosis not present

## 2021-08-14 DIAGNOSIS — Z131 Encounter for screening for diabetes mellitus: Secondary | ICD-10-CM | POA: Diagnosis not present

## 2021-08-14 DIAGNOSIS — Z0001 Encounter for general adult medical examination with abnormal findings: Secondary | ICD-10-CM | POA: Diagnosis not present

## 2021-08-14 DIAGNOSIS — Z125 Encounter for screening for malignant neoplasm of prostate: Secondary | ICD-10-CM | POA: Diagnosis not present

## 2022-09-21 ENCOUNTER — Ambulatory Visit
Admission: EM | Admit: 2022-09-21 | Discharge: 2022-09-21 | Disposition: A | Discharge status: Home / Self Care | Payer: BC Managed Care – PPO | Attending: Emergency Medicine | Admitting: Emergency Medicine

## 2022-09-21 DIAGNOSIS — L089 Local infection of the skin and subcutaneous tissue, unspecified: Secondary | ICD-10-CM

## 2022-09-21 DIAGNOSIS — T148XXA Other injury of unspecified body region, initial encounter: Secondary | ICD-10-CM

## 2022-09-21 DIAGNOSIS — T24231A Burn of second degree of right lower leg, initial encounter: Secondary | ICD-10-CM

## 2022-09-21 MED ORDER — SULFAMETHOXAZOLE-TRIMETHOPRIM 800-160 MG PO TABS: 1.0000 | ORAL_TABLET | Freq: Two times a day (BID) | ORAL | 0 refills | Status: AC

## 2022-09-21 MED ORDER — SILVER SULFADIAZINE 1 % EX CREA: 1.0000 | TOPICAL_CREAM | Freq: Every day | CUTANEOUS | 0 refills | Status: DC
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
## 2022-09-21 NOTE — ED Triage Notes (Signed)
Pt presents with wound to R shin for approx 1 week. Pt reports during a hot stone treatment during pedicure, he noted burning to shin when stone placed. Since that time has had wound. Initially had large bandaid but noted irritation around area. Then used "invisible bandaid," which he took off today. Since then has had throbbing pain. Also notes darkened area to ankle x1 month. Unsure if they are related. No known hx of DM per physical last year per pt.

## 2022-11-05 DIAGNOSIS — Z0001 Encounter for general adult medical examination with abnormal findings: Secondary | ICD-10-CM | POA: Diagnosis not present

## 2022-11-05 DIAGNOSIS — R7303 Prediabetes: Secondary | ICD-10-CM | POA: Diagnosis not present

## 2022-11-05 DIAGNOSIS — E782 Mixed hyperlipidemia: Secondary | ICD-10-CM | POA: Diagnosis not present

## 2022-11-05 DIAGNOSIS — Z1211 Encounter for screening for malignant neoplasm of colon: Secondary | ICD-10-CM | POA: Diagnosis not present

## 2022-11-05 DIAGNOSIS — E669 Obesity, unspecified: Secondary | ICD-10-CM | POA: Diagnosis not present

## 2022-11-05 DIAGNOSIS — R001 Bradycardia, unspecified: Secondary | ICD-10-CM | POA: Diagnosis not present

## 2022-11-05 DIAGNOSIS — Z125 Encounter for screening for malignant neoplasm of prostate: Secondary | ICD-10-CM | POA: Diagnosis not present

## 2022-11-05 DIAGNOSIS — Z23 Encounter for immunization: Secondary | ICD-10-CM | POA: Diagnosis not present

## 2023-04-10 IMAGING — DX DG CHEST 2V
2 series · 2 of 2 positions shown · non-contrast
Comparison: 10/01/2015

CLINICAL DATA: Chest pain and shortness of breath.

EXAM:
CHEST - 2 VIEW

[chest pa]
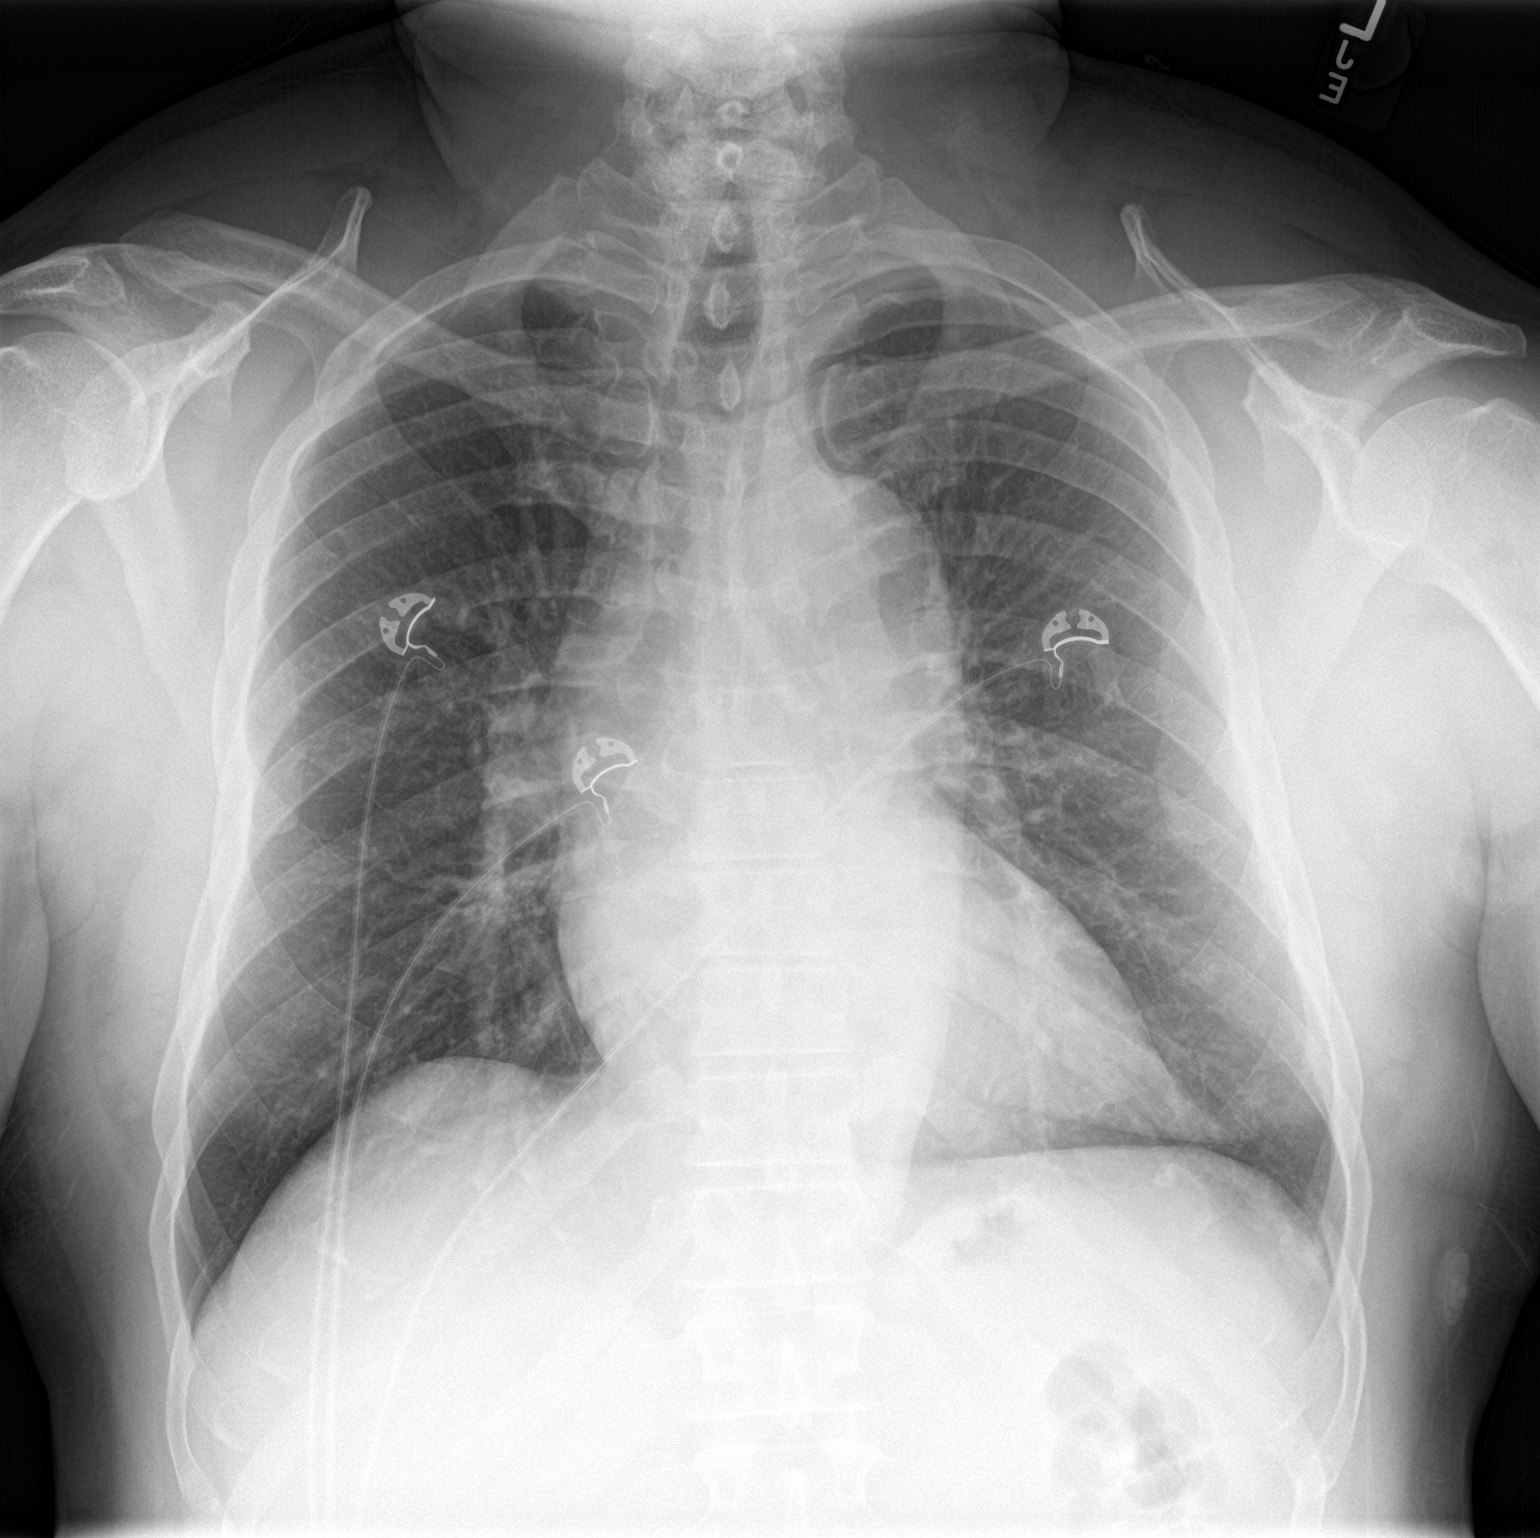

[chest lat]
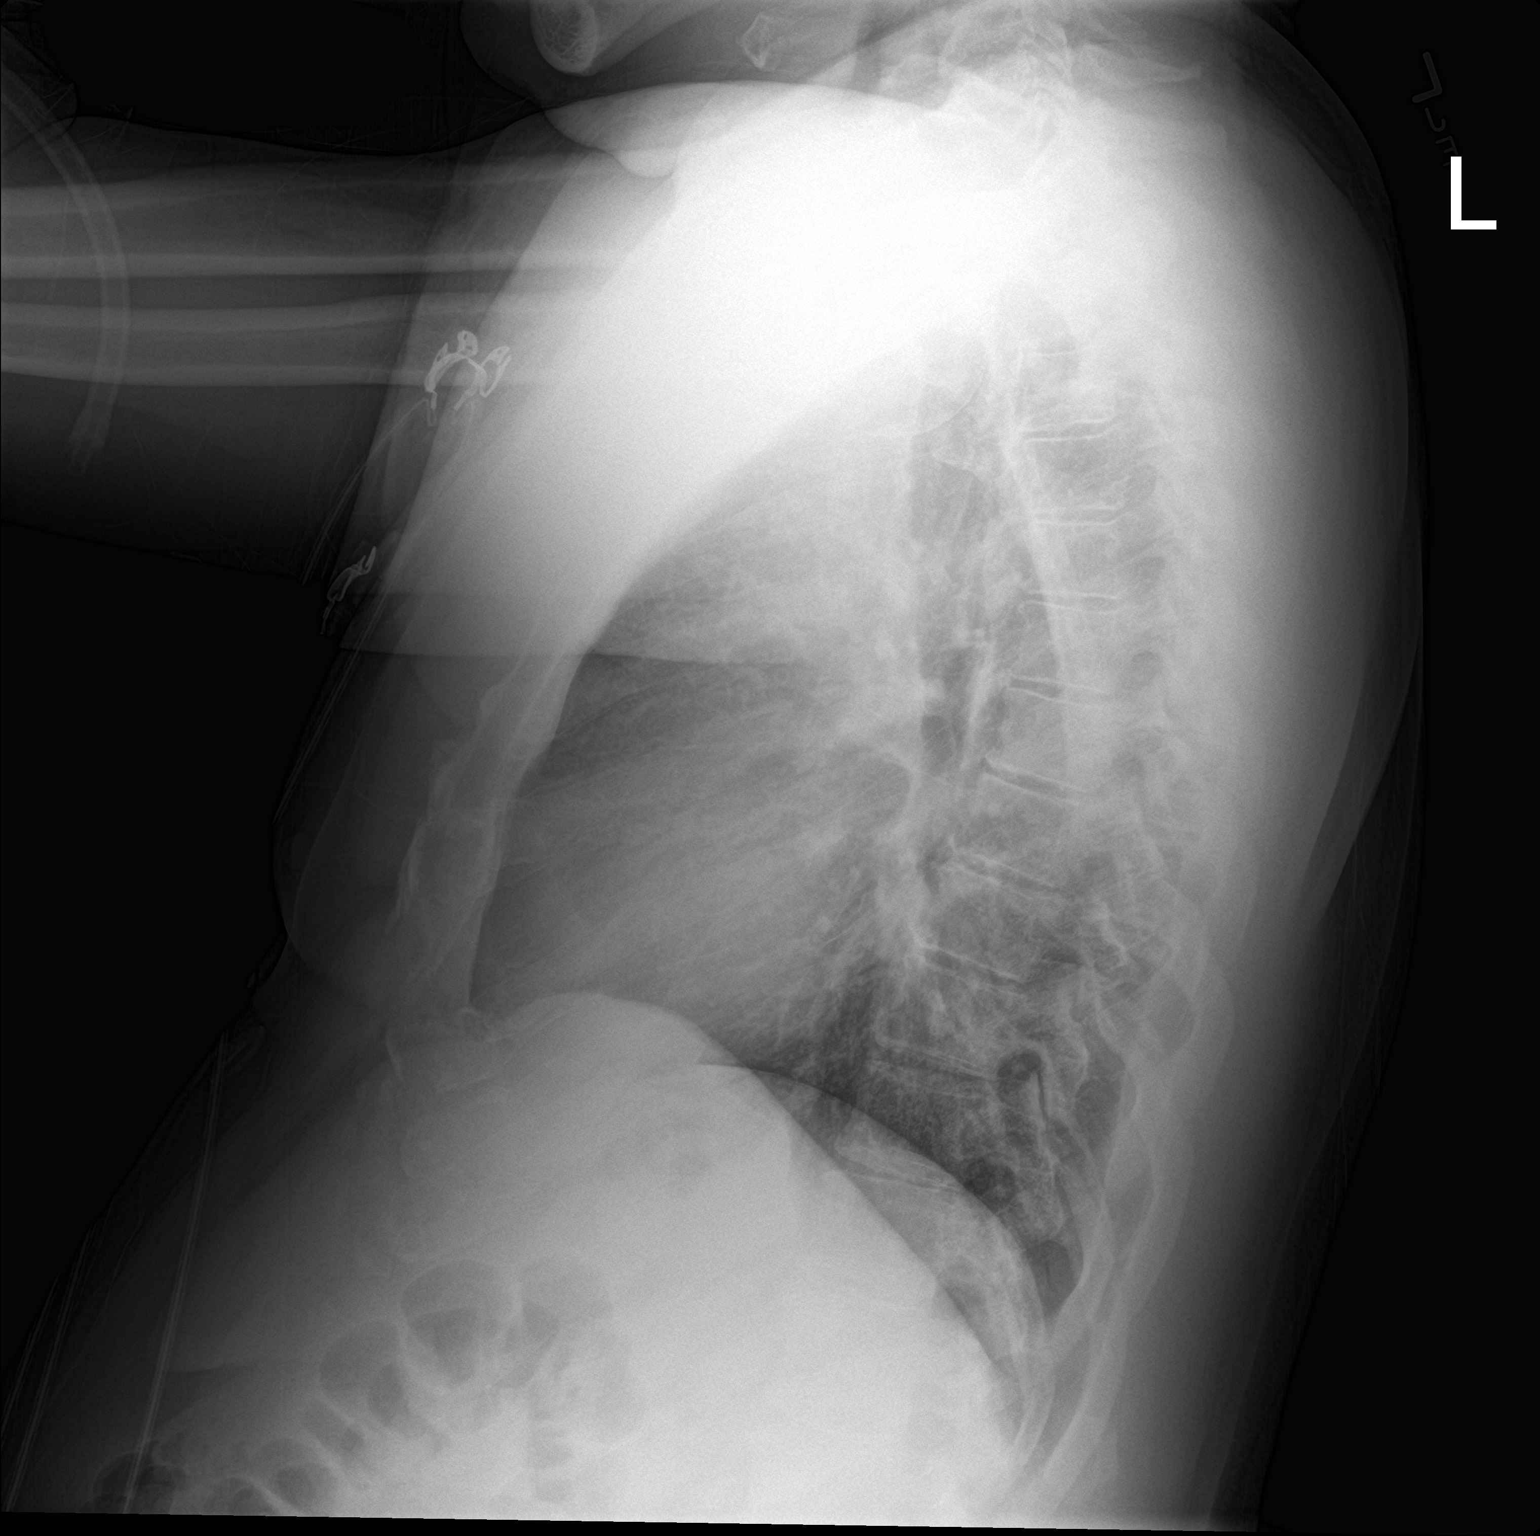

[2 of 2 positions shown; findings below may reference images not displayed]

FINDINGS: The heart size and mediastinal contours are within normal limits.
Both lungs are clear. The visualized skeletal structures are
unremarkable.
IMPRESSION: No active cardiopulmonary disease.

## 2023-04-30 ENCOUNTER — Ambulatory Visit (HOSPITAL_COMMUNITY)
Admission: EM | Admit: 2023-04-30 | Discharge: 2023-04-30 | Disposition: A | Discharge status: Home / Self Care | Attending: Family Medicine | Admitting: Family Medicine

## 2023-04-30 ENCOUNTER — Encounter (HOSPITAL_COMMUNITY): Payer: Self-pay

## 2023-04-30 DIAGNOSIS — L03115 Cellulitis of right lower limb: Secondary | ICD-10-CM | POA: Diagnosis not present

## 2023-04-30 MED ORDER — CEPHALEXIN 500 MG PO CAPS: 500.0000 mg | ORAL_CAPSULE | Freq: Three times a day (TID) | ORAL | 0 refills | Status: AC

## 2023-04-30 NOTE — ED Triage Notes (Signed)
 Pt c/o RLE swelling and tightness x1wk. States using icy hot with relief. States pain worse today and has sores developed  without draining.

## 2023-04-30 NOTE — Discharge Instructions (Signed)
 You were diagnosed with an infection of the right lower leg.  I have sent out an antibiotic to take for this.  Please keep the leg elevated if possible.  Please return if not improving.

## 2023-04-30 NOTE — ED Provider Notes (Signed)
 [facility]    CSN: [id number] Arrival date & time: 04/30/23  0803      History   Chief Complaint Chief Complaint  Patient presents with   Leg Pain    HPI Lance Fisher is a 58 y.o. male.    Leg Pain  Patient is here for RLE swelling and tightness x 1 week.  Primarily at the lower part of the leg, above the ankle.  Today is worse than previous days.  He did note an open sore to the medial lower leg about a week ago, open and draining.  He has several other sores.  No known trauma otherwise.  About a year ago he had an open sore at the lower leg, and this never really healed.  He was given oral abx for that as well.   No recent travel, no long car trips.  No sob or difficulty breathing.        Past Medical History:  Diagnosis Date   BMI 34.0-34.9,adult 08/26/2011   Diverticulitis     Patient Active Problem List   Diagnosis Date Noted   Sigmoid diverticulitis 09/11/2011   Diverticulosis, acute 08/26/2011   BMI 34.0-34.9,adult 08/26/2011    History reviewed. No pertinent surgical history.     Home Medications    Prior to Admission medications   Not on File    Family History History reviewed. No pertinent family history.  Social History Social History   Tobacco Use   Smoking status: Never   Smokeless tobacco: Never  Vaping Use   Vaping status: Never Used  Substance Use Topics   Alcohol use: No   Drug use: No     Allergies   Patient has no known allergies.   Review of Systems Review of Systems  Constitutional: Negative.   HENT: Negative.    Respiratory: Negative.    Cardiovascular: Negative.   Gastrointestinal: Negative.   Skin:  Positive for wound.     Physical Exam Triage Vital Signs ED Triage Vitals  Encounter Vitals Group     BP 04/30/23 0824 127/86     Systolic BP Percentile --      Diastolic BP Percentile --      Pulse Rate 04/30/23 0821 81     Resp 04/30/23 0821 20     Temp 04/30/23 0824 98.1 F (36.7 C)      Temp Source 04/30/23 0824 Oral     SpO2 04/30/23 0821 96 %     Weight --      Height --      Head Circumference --      Peak Flow --      Pain Score 04/30/23 0823 5     Pain Loc --      Pain Education --      Exclude from Growth Chart --    No data found.  Updated Vital Signs BP 127/86 (BP Location: Left Arm)   Pulse 81   Temp 98.1 F (36.7 C) (Oral)   Resp 20   SpO2 96%   Visual Acuity Right Eye Distance:   Left Eye Distance:   Bilateral Distance:    Right Eye Near:   Left Eye Near:    Bilateral Near:     Physical Exam Constitutional:      General: He is not in acute distress.    Appearance: Normal appearance. He is normal weight. He is not ill-appearing or toxic-appearing.  Cardiovascular:     Rate and Rhythm: Normal rate  and regular rhythm.  Pulmonary:     Effort: Pulmonary effort is normal.     Breath sounds: Normal breath sounds.  Skin:    Comments: His right LE is slightly swollen compared to the left, primarily at the lower leg above the ankle;  slight tenderness anteriorly.  There is an open sore/wound to the medial lower leg, and anterior shin;  no active drainage  Neurological:     General: No focal deficit present.     Mental Status: He is alert.  Psychiatric:        Mood and Affect: Mood normal.      UC Treatments / Results  Labs (all labs ordered are listed, but only abnormal results are displayed) Labs Reviewed - No data to display  EKG   Radiology No results found.  Procedures Procedures (including critical care time)  Medications Ordered in UC Medications - No data to display  Initial Impression / Assessment and Plan / UC Course  I have reviewed the triage vital signs and the nursing notes.  Pertinent labs & imaging results that were available during my care of the patient were reviewed by me and considered in my medical decision making (see chart for details).   Final Clinical Impressions(s) / UC Diagnoses   Final  diagnoses:  Cellulitis of right lower extremity     Discharge Instructions      You were diagnosed with an infection of the right lower leg.  I have sent out an antibiotic to take for this.  Please keep the leg elevated if possible.  Please return if not improving.     ED Prescriptions     Medication Sig Dispense Auth. Provider   cephALEXin  (KEFLEX ) 500 MG capsule Take 1 capsule (500 mg total) by mouth 3 (three) times daily for 10 days. 30 capsule Lesle Ras, MD      PDMP not reviewed this encounter.   Lesle Ras, MD 04/30/23 [id number]

## 2023-05-05 DIAGNOSIS — Z7689 Persons encountering health services in other specified circumstances: Secondary | ICD-10-CM | POA: Diagnosis not present

## 2023-05-05 DIAGNOSIS — L03115 Cellulitis of right lower limb: Secondary | ICD-10-CM | POA: Diagnosis not present

## 2023-05-13 DIAGNOSIS — M79661 Pain in right lower leg: Secondary | ICD-10-CM | POA: Diagnosis not present

## 2023-05-13 DIAGNOSIS — L0390 Cellulitis, unspecified: Secondary | ICD-10-CM | POA: Diagnosis not present

## 2023-05-13 DIAGNOSIS — Z7689 Persons encountering health services in other specified circumstances: Secondary | ICD-10-CM | POA: Diagnosis not present

## 2023-05-13 DIAGNOSIS — L03115 Cellulitis of right lower limb: Secondary | ICD-10-CM | POA: Diagnosis not present

## 2023-05-13 DIAGNOSIS — M7989 Other specified soft tissue disorders: Secondary | ICD-10-CM | POA: Diagnosis not present

## 2023-05-14 ENCOUNTER — Other Ambulatory Visit: Payer: Self-pay | Admitting: Family Medicine

## 2023-05-14 DIAGNOSIS — M79661 Pain in right lower leg: Secondary | ICD-10-CM

## 2023-05-14 DIAGNOSIS — M7989 Other specified soft tissue disorders: Secondary | ICD-10-CM

## 2023-05-15 ENCOUNTER — Ambulatory Visit
Admission: RE | Admit: 2023-05-15 | Discharge: 2023-05-15 | Disposition: A | Discharge status: Home / Self Care | Source: Ambulatory Visit | Attending: Family Medicine | Admitting: Family Medicine

## 2023-05-15 DIAGNOSIS — M79661 Pain in right lower leg: Secondary | ICD-10-CM

## 2023-05-15 DIAGNOSIS — M7989 Other specified soft tissue disorders: Secondary | ICD-10-CM

## 2023-05-15 DIAGNOSIS — R600 Localized edema: Secondary | ICD-10-CM | POA: Diagnosis not present

## 2023-05-15 DIAGNOSIS — M79604 Pain in right leg: Secondary | ICD-10-CM | POA: Diagnosis not present

## 2023-05-20 DIAGNOSIS — L03115 Cellulitis of right lower limb: Secondary | ICD-10-CM | POA: Diagnosis not present

## 2023-05-20 DIAGNOSIS — M7989 Other specified soft tissue disorders: Secondary | ICD-10-CM | POA: Diagnosis not present

## 2023-05-20 DIAGNOSIS — M79661 Pain in right lower leg: Secondary | ICD-10-CM | POA: Diagnosis not present

## 2023-05-20 DIAGNOSIS — D171 Benign lipomatous neoplasm of skin and subcutaneous tissue of trunk: Secondary | ICD-10-CM | POA: Diagnosis not present

## 2023-06-20 DIAGNOSIS — L03115 Cellulitis of right lower limb: Secondary | ICD-10-CM | POA: Diagnosis not present

## 2023-06-20 DIAGNOSIS — M79661 Pain in right lower leg: Secondary | ICD-10-CM | POA: Diagnosis not present

## 2023-06-20 DIAGNOSIS — M7989 Other specified soft tissue disorders: Secondary | ICD-10-CM | POA: Diagnosis not present

## 2023-07-02 ENCOUNTER — Emergency Department (HOSPITAL_COMMUNITY)
Admission: EM | Admit: 2023-07-02 | Discharge: 2023-07-02 | Disposition: A | Discharge status: Home / Self Care | Attending: Emergency Medicine | Admitting: Emergency Medicine

## 2023-07-02 ENCOUNTER — Other Ambulatory Visit: Payer: Self-pay

## 2023-07-02 DIAGNOSIS — L03115 Cellulitis of right lower limb: Secondary | ICD-10-CM | POA: Insufficient documentation

## 2023-07-02 LAB — CBC WITH DIFFERENTIAL/PLATELET
Abs Immature Granulocytes: 0.02 10*3/uL (ref 0.00–0.07)
Basophils Absolute: 0 10*3/uL (ref 0.0–0.1)
Basophils Relative: 0 %
Eosinophils Absolute: 0.4 10*3/uL (ref 0.0–0.5)
Eosinophils Relative: 5 %
HCT: 41 % (ref 39.0–52.0)
Hemoglobin: 13 g/dL (ref 13.0–17.0)
Immature Granulocytes: 0 %
Lymphocytes Relative: 16 %
Lymphs Abs: 1.1 10*3/uL (ref 0.7–4.0)
MCH: 27.1 pg (ref 26.0–34.0)
MCHC: 31.7 g/dL (ref 30.0–36.0)
MCV: 85.6 fL (ref 80.0–100.0)
Monocytes Absolute: 0.6 10*3/uL (ref 0.1–1.0)
Monocytes Relative: 8 %
Neutro Abs: 4.7 10*3/uL (ref 1.7–7.7)
Neutrophils Relative %: 71 %
Platelets: 246 10*3/uL (ref 150–400)
RBC: 4.79 MIL/uL (ref 4.22–5.81)
RDW: 15.2 % (ref 11.5–15.5)
WBC: 6.8 10*3/uL (ref 4.0–10.5)
nRBC: 0 % (ref 0.0–0.2)

## 2023-07-02 LAB — COMPREHENSIVE METABOLIC PANEL WITH GFR
ALT: 59 U/L — ABNORMAL HIGH (ref 0–44)
AST: 49 U/L — ABNORMAL HIGH (ref 15–41)
Albumin: 3.8 g/dL (ref 3.5–5.0)
Alkaline Phosphatase: 62 U/L (ref 38–126)
Anion gap: 9 (ref 5–15)
BUN: 19 mg/dL (ref 6–20)
CO2: 24 mmol/L (ref 22–32)
Calcium: 9.2 mg/dL (ref 8.9–10.3)
Chloride: 103 mmol/L (ref 98–111)
Creatinine, Ser: 1.03 mg/dL (ref 0.61–1.24)
GFR, Estimated: >60 mL/min (ref >60)
Glucose, Bld: 121 mg/dL — ABNORMAL HIGH (ref 70–99)
Potassium: 4.1 mmol/L (ref 3.5–5.1)
Sodium: 136 mmol/L (ref 135–145)
Total Bilirubin: 0.7 mg/dL (ref 0.0–1.2)
Total Protein: 7.5 g/dL (ref 6.5–8.1)

## 2023-07-02 MED ORDER — VANCOMYCIN HCL IN DEXTROSE 1-5 GM/200ML-% IV SOLN
1000.0000 mg | Freq: Once | INTRAVENOUS | Status: AC
  Administered 2023-07-02: 1000 mg via INTRAVENOUS
  Filled 2023-07-02: qty 200

## 2023-07-02 MED ORDER — CEPHALEXIN 500 MG PO CAPS: 500.0000 mg | ORAL_CAPSULE | Freq: Four times a day (QID) | ORAL | 0 refills | Status: AC

## 2023-07-02 NOTE — ED Triage Notes (Signed)
 Pt arrived reporting RLE pain. Has a wound to leg, wound care appt in August. However, reports area has been more swollen and is causing him a lot of pain at night. No other symptoms

## 2023-07-02 NOTE — ED Provider Notes (Incomplete)
  [place] [facility] Provider Note   CSN: [id number] Arrival date & time: 07/02/23  9083     Patient presents with: Wound Check   Lance Fisher is a 58 y.o. male.  {Add pertinent medical, surgical, social history, OB history to YEP:67052} Patient started with swelling and redness to right foot.  He has a small wound to his ankle.   Wound Check       Prior to Admission medications   Medication Sig Start Date End Date Taking? Authorizing Provider  cephALEXin  (KEFLEX ) 500 MG capsule Take 1 capsule (500 mg total) by mouth 4 (four) times daily. 07/02/23  Yes Suzette Pac, MD    Allergies: Patient has no known allergies.    Review of Systems  Updated Vital Signs BP (!) 148/95   Pulse (!) 58   Temp 97.6 F (36.4 C) (Oral)   Resp 17   SpO2 100%   Physical Exam  (all labs ordered are listed, but only abnormal results are displayed) Labs Reviewed  COMPREHENSIVE METABOLIC PANEL WITH GFR - Abnormal; Notable for the following components:      Result Value   Glucose, Bld 121 (*)    AST 49 (*)    ALT 59 (*)    All other components within normal limits  CBC WITH DIFFERENTIAL/PLATELET    EKG: None  Radiology: No results found.  {Document cardiac monitor, telemetry assessment procedure when appropriate:32947} Procedures   Medications Ordered in the ED  vancomycin (VANCOCIN) IVPB 1000 mg/200 mL premix (1,000 mg Intravenous New Bag/Given 07/02/23 1008)   CBC and chemistries unremarkable.   {Click here for ABCD2, HEART and other calculators REFRESH Note before signing:1}                              Medical Decision Making Amount and/or Complexity of Data Reviewed Labs: ordered.  Risk Prescription drug management.   Patient with right foot cellulitis.  He started on Keflex  and was given 1 g of pink in the emergency department.  He will follow-up next week or sooner if getting worse  {Document critical care time when  appropriate  Document review of labs and clinical decision tools ie CHADS2VASC2, etc  Document your independent review of radiology images and any outside records  Document your discussion with family members, caretakers and with consultants  Document social determinants of health affecting pt's care  Document your decision making why or why not admission, treatments were needed:32947:::1}   Final diagnoses:  Cellulitis of right foot    ED Discharge Orders          Ordered    cephALEXin  (KEFLEX ) 500 MG capsule  4 times daily        07/02/23 1144

## 2023-07-02 NOTE — Discharge Instructions (Signed)
 Follow-up next week for recheck.  Get seen sooner if your foot is getting worse.

## 2023-07-25 ENCOUNTER — Encounter (HOSPITAL_BASED_OUTPATIENT_CLINIC_OR_DEPARTMENT_OTHER): Attending: Internal Medicine | Admitting: Internal Medicine

## 2023-07-25 DIAGNOSIS — I87311 Chronic venous hypertension (idiopathic) with ulcer of right lower extremity: Secondary | ICD-10-CM | POA: Diagnosis not present

## 2023-07-25 DIAGNOSIS — L97812 Non-pressure chronic ulcer of other part of right lower leg with fat layer exposed: Secondary | ICD-10-CM | POA: Diagnosis not present

## 2023-08-04 ENCOUNTER — Encounter (HOSPITAL_BASED_OUTPATIENT_CLINIC_OR_DEPARTMENT_OTHER): Admitting: Internal Medicine

## 2023-08-04 DIAGNOSIS — I87311 Chronic venous hypertension (idiopathic) with ulcer of right lower extremity: Secondary | ICD-10-CM | POA: Diagnosis not present

## 2023-08-04 DIAGNOSIS — L97812 Non-pressure chronic ulcer of other part of right lower leg with fat layer exposed: Secondary | ICD-10-CM | POA: Diagnosis not present

## 2023-08-11 ENCOUNTER — Encounter (HOSPITAL_BASED_OUTPATIENT_CLINIC_OR_DEPARTMENT_OTHER): Attending: Internal Medicine | Admitting: Internal Medicine

## 2023-08-11 DIAGNOSIS — L97812 Non-pressure chronic ulcer of other part of right lower leg with fat layer exposed: Secondary | ICD-10-CM | POA: Insufficient documentation

## 2023-08-11 DIAGNOSIS — I87311 Chronic venous hypertension (idiopathic) with ulcer of right lower extremity: Secondary | ICD-10-CM | POA: Diagnosis not present

## 2023-08-14 ENCOUNTER — Ambulatory Visit (HOSPITAL_BASED_OUTPATIENT_CLINIC_OR_DEPARTMENT_OTHER): Admitting: General Surgery

## 2023-08-18 ENCOUNTER — Encounter (HOSPITAL_BASED_OUTPATIENT_CLINIC_OR_DEPARTMENT_OTHER): Payer: Self-pay | Admitting: Internal Medicine

## 2023-08-18 DIAGNOSIS — L97812 Non-pressure chronic ulcer of other part of right lower leg with fat layer exposed: Secondary | ICD-10-CM | POA: Diagnosis not present

## 2023-08-18 DIAGNOSIS — I87311 Chronic venous hypertension (idiopathic) with ulcer of right lower extremity: Secondary | ICD-10-CM | POA: Diagnosis not present

## 2023-08-25 ENCOUNTER — Encounter (HOSPITAL_BASED_OUTPATIENT_CLINIC_OR_DEPARTMENT_OTHER): Admitting: Internal Medicine

## 2023-08-25 DIAGNOSIS — I87311 Chronic venous hypertension (idiopathic) with ulcer of right lower extremity: Secondary | ICD-10-CM | POA: Diagnosis not present

## 2023-08-25 DIAGNOSIS — L97812 Non-pressure chronic ulcer of other part of right lower leg with fat layer exposed: Secondary | ICD-10-CM

## 2023-09-01 ENCOUNTER — Encounter (HOSPITAL_BASED_OUTPATIENT_CLINIC_OR_DEPARTMENT_OTHER): Admitting: Internal Medicine

## 2023-09-01 DIAGNOSIS — I87311 Chronic venous hypertension (idiopathic) with ulcer of right lower extremity: Secondary | ICD-10-CM | POA: Diagnosis not present

## 2023-09-01 DIAGNOSIS — L97812 Non-pressure chronic ulcer of other part of right lower leg with fat layer exposed: Secondary | ICD-10-CM

## 2023-09-09 ENCOUNTER — Ambulatory Visit (HOSPITAL_BASED_OUTPATIENT_CLINIC_OR_DEPARTMENT_OTHER): Admitting: Internal Medicine

## 2023-09-10 ENCOUNTER — Encounter (HOSPITAL_BASED_OUTPATIENT_CLINIC_OR_DEPARTMENT_OTHER): Attending: Internal Medicine | Admitting: Internal Medicine

## 2023-09-10 DIAGNOSIS — L97812 Non-pressure chronic ulcer of other part of right lower leg with fat layer exposed: Secondary | ICD-10-CM | POA: Insufficient documentation

## 2023-09-10 DIAGNOSIS — I87311 Chronic venous hypertension (idiopathic) with ulcer of right lower extremity: Secondary | ICD-10-CM | POA: Insufficient documentation

## 2023-09-15 ENCOUNTER — Encounter (HOSPITAL_BASED_OUTPATIENT_CLINIC_OR_DEPARTMENT_OTHER): Admitting: Internal Medicine

## 2023-09-15 DIAGNOSIS — L97812 Non-pressure chronic ulcer of other part of right lower leg with fat layer exposed: Secondary | ICD-10-CM

## 2023-09-15 DIAGNOSIS — I87311 Chronic venous hypertension (idiopathic) with ulcer of right lower extremity: Secondary | ICD-10-CM

## 2023-09-22 ENCOUNTER — Encounter (HOSPITAL_BASED_OUTPATIENT_CLINIC_OR_DEPARTMENT_OTHER): Payer: Self-pay | Admitting: Internal Medicine

## 2023-09-22 DIAGNOSIS — I87311 Chronic venous hypertension (idiopathic) with ulcer of right lower extremity: Secondary | ICD-10-CM

## 2023-09-22 DIAGNOSIS — L97812 Non-pressure chronic ulcer of other part of right lower leg with fat layer exposed: Secondary | ICD-10-CM

## 2023-09-29 ENCOUNTER — Encounter (HOSPITAL_BASED_OUTPATIENT_CLINIC_OR_DEPARTMENT_OTHER): Admitting: Internal Medicine

## 2023-09-29 DIAGNOSIS — I87311 Chronic venous hypertension (idiopathic) with ulcer of right lower extremity: Secondary | ICD-10-CM | POA: Diagnosis not present

## 2023-09-29 DIAGNOSIS — L97812 Non-pressure chronic ulcer of other part of right lower leg with fat layer exposed: Secondary | ICD-10-CM | POA: Diagnosis not present

## 2023-10-06 ENCOUNTER — Encounter (HOSPITAL_BASED_OUTPATIENT_CLINIC_OR_DEPARTMENT_OTHER): Admitting: Internal Medicine

## 2023-10-06 DIAGNOSIS — L97812 Non-pressure chronic ulcer of other part of right lower leg with fat layer exposed: Secondary | ICD-10-CM | POA: Diagnosis not present

## 2023-10-06 DIAGNOSIS — I87311 Chronic venous hypertension (idiopathic) with ulcer of right lower extremity: Secondary | ICD-10-CM

## 2023-10-13 ENCOUNTER — Encounter (HOSPITAL_BASED_OUTPATIENT_CLINIC_OR_DEPARTMENT_OTHER): Attending: Internal Medicine | Admitting: Internal Medicine

## 2023-10-13 DIAGNOSIS — L97812 Non-pressure chronic ulcer of other part of right lower leg with fat layer exposed: Secondary | ICD-10-CM | POA: Insufficient documentation

## 2023-10-13 DIAGNOSIS — I87311 Chronic venous hypertension (idiopathic) with ulcer of right lower extremity: Secondary | ICD-10-CM | POA: Insufficient documentation

## 2023-10-20 ENCOUNTER — Encounter (HOSPITAL_BASED_OUTPATIENT_CLINIC_OR_DEPARTMENT_OTHER): Admitting: Internal Medicine

## 2023-10-20 DIAGNOSIS — I87311 Chronic venous hypertension (idiopathic) with ulcer of right lower extremity: Secondary | ICD-10-CM | POA: Diagnosis not present

## 2023-10-20 DIAGNOSIS — L97812 Non-pressure chronic ulcer of other part of right lower leg with fat layer exposed: Secondary | ICD-10-CM | POA: Diagnosis not present

## 2023-10-27 ENCOUNTER — Encounter (HOSPITAL_BASED_OUTPATIENT_CLINIC_OR_DEPARTMENT_OTHER): Admitting: Internal Medicine

## 2023-10-27 DIAGNOSIS — I87311 Chronic venous hypertension (idiopathic) with ulcer of right lower extremity: Secondary | ICD-10-CM | POA: Diagnosis not present

## 2023-10-27 DIAGNOSIS — L97812 Non-pressure chronic ulcer of other part of right lower leg with fat layer exposed: Secondary | ICD-10-CM

## 2023-11-03 ENCOUNTER — Encounter (HOSPITAL_BASED_OUTPATIENT_CLINIC_OR_DEPARTMENT_OTHER): Admitting: Internal Medicine

## 2023-11-03 DIAGNOSIS — I87311 Chronic venous hypertension (idiopathic) with ulcer of right lower extremity: Secondary | ICD-10-CM

## 2023-11-03 DIAGNOSIS — L97812 Non-pressure chronic ulcer of other part of right lower leg with fat layer exposed: Secondary | ICD-10-CM

## 2023-11-10 ENCOUNTER — Encounter (HOSPITAL_BASED_OUTPATIENT_CLINIC_OR_DEPARTMENT_OTHER): Attending: Internal Medicine | Admitting: Internal Medicine

## 2023-11-10 DIAGNOSIS — L97812 Non-pressure chronic ulcer of other part of right lower leg with fat layer exposed: Secondary | ICD-10-CM | POA: Insufficient documentation

## 2023-11-10 DIAGNOSIS — I87311 Chronic venous hypertension (idiopathic) with ulcer of right lower extremity: Secondary | ICD-10-CM | POA: Insufficient documentation

## 2023-11-17 ENCOUNTER — Encounter (HOSPITAL_BASED_OUTPATIENT_CLINIC_OR_DEPARTMENT_OTHER): Admitting: Internal Medicine

## 2023-11-17 DIAGNOSIS — L97812 Non-pressure chronic ulcer of other part of right lower leg with fat layer exposed: Secondary | ICD-10-CM | POA: Diagnosis not present

## 2023-11-17 DIAGNOSIS — I87311 Chronic venous hypertension (idiopathic) with ulcer of right lower extremity: Secondary | ICD-10-CM | POA: Diagnosis not present

## 2023-11-24 ENCOUNTER — Encounter (HOSPITAL_BASED_OUTPATIENT_CLINIC_OR_DEPARTMENT_OTHER): Admitting: General Surgery

## 2023-12-01 ENCOUNTER — Encounter (HOSPITAL_BASED_OUTPATIENT_CLINIC_OR_DEPARTMENT_OTHER): Admitting: Internal Medicine

## 2023-12-01 DIAGNOSIS — L97812 Non-pressure chronic ulcer of other part of right lower leg with fat layer exposed: Secondary | ICD-10-CM

## 2023-12-01 DIAGNOSIS — I87311 Chronic venous hypertension (idiopathic) with ulcer of right lower extremity: Secondary | ICD-10-CM | POA: Diagnosis not present

## 2023-12-08 ENCOUNTER — Encounter (HOSPITAL_BASED_OUTPATIENT_CLINIC_OR_DEPARTMENT_OTHER): Attending: Internal Medicine | Admitting: Internal Medicine

## 2023-12-08 DIAGNOSIS — I87311 Chronic venous hypertension (idiopathic) with ulcer of right lower extremity: Secondary | ICD-10-CM | POA: Diagnosis present

## 2023-12-08 DIAGNOSIS — L97812 Non-pressure chronic ulcer of other part of right lower leg with fat layer exposed: Secondary | ICD-10-CM | POA: Diagnosis not present

## 2023-12-15 ENCOUNTER — Encounter (HOSPITAL_BASED_OUTPATIENT_CLINIC_OR_DEPARTMENT_OTHER): Admitting: Internal Medicine

## 2023-12-22 ENCOUNTER — Encounter (HOSPITAL_BASED_OUTPATIENT_CLINIC_OR_DEPARTMENT_OTHER): Admitting: Internal Medicine
